# Patient Record
Sex: Female | Born: 1945 | Race: White | Hispanic: No | Marital: Married | Smoking: Never smoker
Health system: Southern US, Community
[De-identification: ages and names within clinical notes are randomized; demographics above are authoritative.]

## PROBLEM LIST (undated history)

## (undated) DIAGNOSIS — E785 Hyperlipidemia, unspecified: Secondary | ICD-10-CM

## (undated) DIAGNOSIS — E119 Type 2 diabetes mellitus without complications: Secondary | ICD-10-CM

## (undated) DIAGNOSIS — K7460 Unspecified cirrhosis of liver: Secondary | ICD-10-CM

## (undated) DIAGNOSIS — E039 Hypothyroidism, unspecified: Secondary | ICD-10-CM

## (undated) HISTORY — DX: Type 2 diabetes mellitus without complications: E11.9

## (undated) HISTORY — PX: ABDOMINAL HYSTERECTOMY: SHX81

## (undated) HISTORY — DX: Hyperlipidemia, unspecified: E78.5

## (undated) HISTORY — PX: TONSILLECTOMY: SUR1361

## (undated) HISTORY — DX: Hypothyroidism, unspecified: E03.9

## (undated) HISTORY — PX: BUNIONECTOMY: SHX129

## (undated) HISTORY — PX: APPENDECTOMY: SHX54

## (undated) HISTORY — DX: Unspecified cirrhosis of liver: K74.60

---

## 2000-10-19 ENCOUNTER — Other Ambulatory Visit: Admission: RE | Admit: 2000-10-19 | Discharge: 2000-10-19 | Payer: Self-pay | Admitting: General Surgery

## 2001-04-19 ENCOUNTER — Ambulatory Visit (HOSPITAL_COMMUNITY): Admission: RE | Admit: 2001-04-19 | Discharge: 2001-04-19 | Payer: Self-pay | Admitting: *Deleted

## 2001-04-19 ENCOUNTER — Encounter: Payer: Self-pay | Admitting: *Deleted

## 2001-05-03 ENCOUNTER — Ambulatory Visit (HOSPITAL_COMMUNITY): Admission: RE | Admit: 2001-05-03 | Discharge: 2001-05-03 | Payer: Self-pay | Admitting: Cardiology

## 2006-11-13 ENCOUNTER — Ambulatory Visit (HOSPITAL_COMMUNITY): Admission: RE | Admit: 2006-11-13 | Discharge: 2006-11-13 | Payer: Self-pay | Admitting: Family Medicine

## 2006-12-19 ENCOUNTER — Emergency Department (HOSPITAL_COMMUNITY): Admission: EM | Admit: 2006-12-19 | Discharge: 2006-12-20 | Payer: Self-pay | Admitting: Emergency Medicine

## 2007-04-13 ENCOUNTER — Ambulatory Visit (HOSPITAL_COMMUNITY): Admission: RE | Admit: 2007-04-13 | Discharge: 2007-04-13 | Payer: Self-pay | Admitting: Family Medicine

## 2010-06-11 ENCOUNTER — Ambulatory Visit (HOSPITAL_COMMUNITY)
Admission: RE | Admit: 2010-06-11 | Discharge: 2010-06-11 | Payer: Self-pay | Source: Home / Self Care | Admitting: Family Medicine

## 2011-04-24 LAB — CBC
MCHC: 34.6
RBC: 4.11
RDW: 13.1

## 2011-04-24 LAB — URINALYSIS, ROUTINE W REFLEX MICROSCOPIC
Glucose, UA: NEGATIVE
Ketones, ur: NEGATIVE
Urobilinogen, UA: 0.2

## 2011-04-24 LAB — URINE CULTURE: Colony Count: 30000

## 2011-04-24 LAB — BASIC METABOLIC PANEL
CO2: 25
Calcium: 9.3
Creatinine, Ser: 1.07
GFR calc Af Amer: >60
GFR calc non Af Amer: 52 — ABNORMAL LOW
Glucose, Bld: 154 — ABNORMAL HIGH
Sodium: 137

## 2011-04-24 LAB — DIFFERENTIAL
Basophils Absolute: 0.1
Basophils Relative: 1
Lymphocytes Relative: 21
Neutro Abs: 7.9 — ABNORMAL HIGH
Neutrophils Relative %: 66

## 2011-04-24 LAB — URINE MICROSCOPIC-ADD ON

## 2011-05-26 ENCOUNTER — Other Ambulatory Visit (HOSPITAL_COMMUNITY): Payer: Self-pay | Admitting: Internal Medicine

## 2011-05-26 DIAGNOSIS — Z139 Encounter for screening, unspecified: Secondary | ICD-10-CM

## 2011-05-28 ENCOUNTER — Other Ambulatory Visit (HOSPITAL_COMMUNITY): Payer: Self-pay

## 2011-06-03 ENCOUNTER — Other Ambulatory Visit (HOSPITAL_COMMUNITY): Payer: Self-pay

## 2012-02-11 ENCOUNTER — Encounter (INDEPENDENT_AMBULATORY_CARE_PROVIDER_SITE_OTHER): Payer: Self-pay | Admitting: *Deleted

## 2012-06-16 ENCOUNTER — Other Ambulatory Visit (HOSPITAL_COMMUNITY): Payer: Self-pay | Admitting: Internal Medicine

## 2012-06-16 DIAGNOSIS — Z01419 Encounter for gynecological examination (general) (routine) without abnormal findings: Secondary | ICD-10-CM

## 2012-06-17 ENCOUNTER — Other Ambulatory Visit (HOSPITAL_COMMUNITY): Payer: Self-pay | Admitting: Family Medicine

## 2012-06-17 DIAGNOSIS — Z139 Encounter for screening, unspecified: Secondary | ICD-10-CM

## 2012-06-21 ENCOUNTER — Other Ambulatory Visit (HOSPITAL_COMMUNITY): Payer: Self-pay

## 2012-06-23 ENCOUNTER — Ambulatory Visit (HOSPITAL_COMMUNITY)
Admission: RE | Admit: 2012-06-23 | Discharge: 2012-06-23 | Disposition: A | Discharge status: Home / Self Care | Payer: Medicare Other | Source: Ambulatory Visit | Attending: Internal Medicine | Admitting: Internal Medicine

## 2012-06-23 DIAGNOSIS — M899 Disorder of bone, unspecified: Secondary | ICD-10-CM | POA: Insufficient documentation

## 2012-06-23 DIAGNOSIS — Z01419 Encounter for gynecological examination (general) (routine) without abnormal findings: Secondary | ICD-10-CM

## 2012-06-23 DIAGNOSIS — M949 Disorder of cartilage, unspecified: Secondary | ICD-10-CM | POA: Insufficient documentation

## 2012-06-24 ENCOUNTER — Encounter (INDEPENDENT_AMBULATORY_CARE_PROVIDER_SITE_OTHER): Payer: Self-pay | Admitting: *Deleted

## 2012-06-24 ENCOUNTER — Ambulatory Visit (HOSPITAL_COMMUNITY): Payer: Self-pay

## 2012-07-01 ENCOUNTER — Ambulatory Visit (HOSPITAL_COMMUNITY): Payer: Self-pay

## 2012-07-06 ENCOUNTER — Ambulatory Visit (HOSPITAL_COMMUNITY)
Admission: RE | Admit: 2012-07-06 | Discharge: 2012-07-06 | Disposition: A | Discharge status: Home / Self Care | Payer: Medicare Other | Source: Ambulatory Visit | Attending: Family Medicine | Admitting: Family Medicine

## 2012-07-06 DIAGNOSIS — Z139 Encounter for screening, unspecified: Secondary | ICD-10-CM

## 2012-07-06 DIAGNOSIS — Z1231 Encounter for screening mammogram for malignant neoplasm of breast: Secondary | ICD-10-CM | POA: Insufficient documentation

## 2013-06-14 ENCOUNTER — Encounter (INDEPENDENT_AMBULATORY_CARE_PROVIDER_SITE_OTHER): Payer: Self-pay | Admitting: *Deleted

## 2013-07-05 ENCOUNTER — Telehealth: Payer: Self-pay

## 2013-07-05 NOTE — Telephone Encounter (Signed)
Pt was referred by Jean Rosenthal, [facility] for screening colonoscopy. I called and spoke to female who said he will give her a message to call.

## 2013-08-06 IMAGING — MG MM DIGITAL SCREENING BILAT
4 series · 4 of 4 positions shown · non-contrast
Comparison: Previous exams.

CLINICAL DATA: Screening.

DIGITAL BILATERAL SCREENING MAMMOGRAM WITH CAD

[L CC]
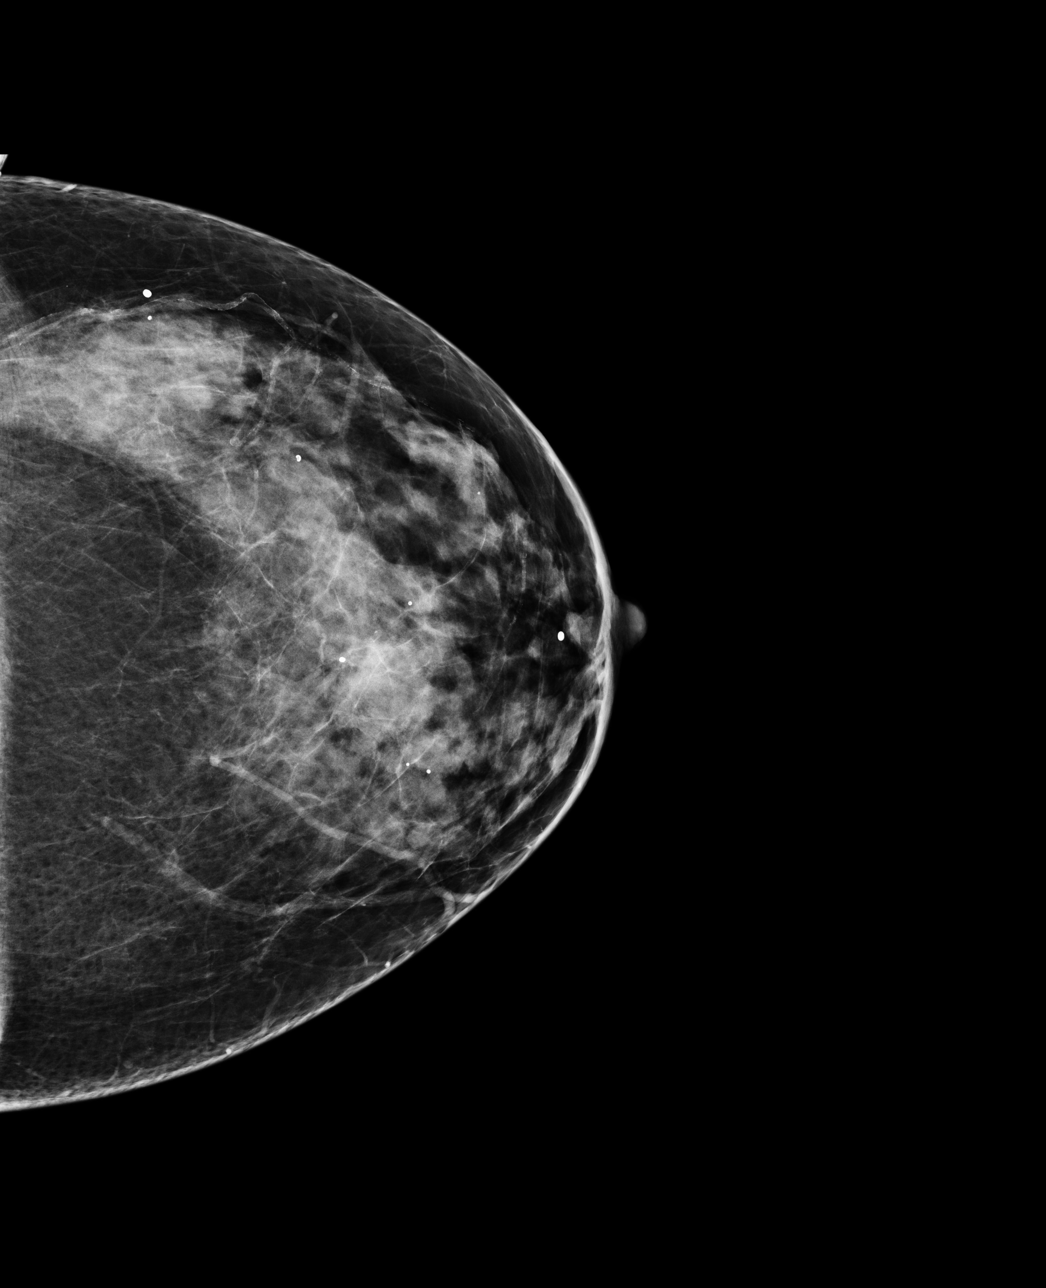

[L MLO]
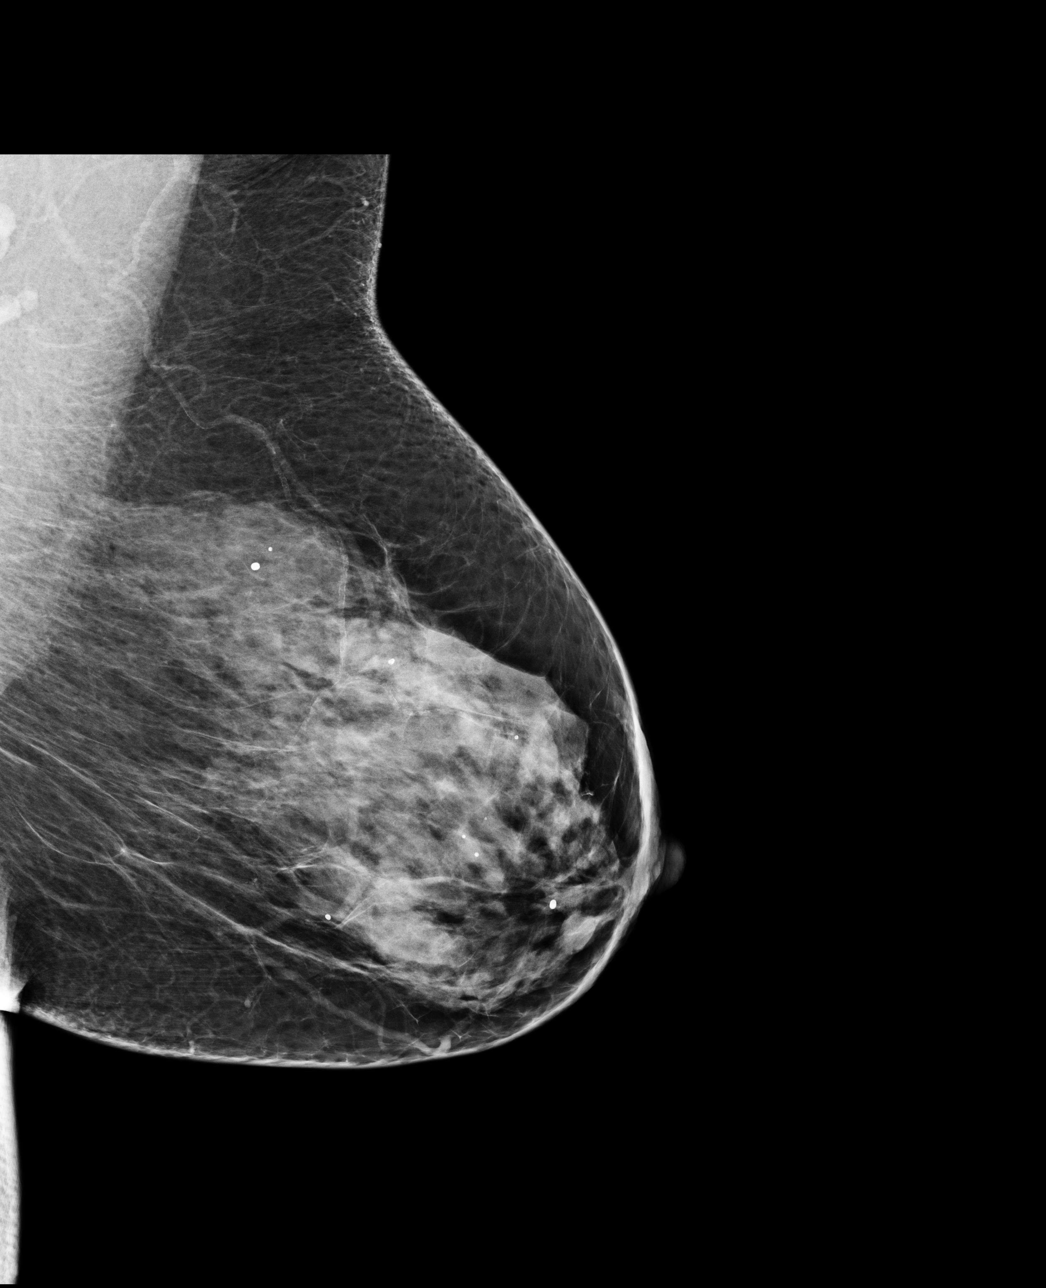

[R CC]
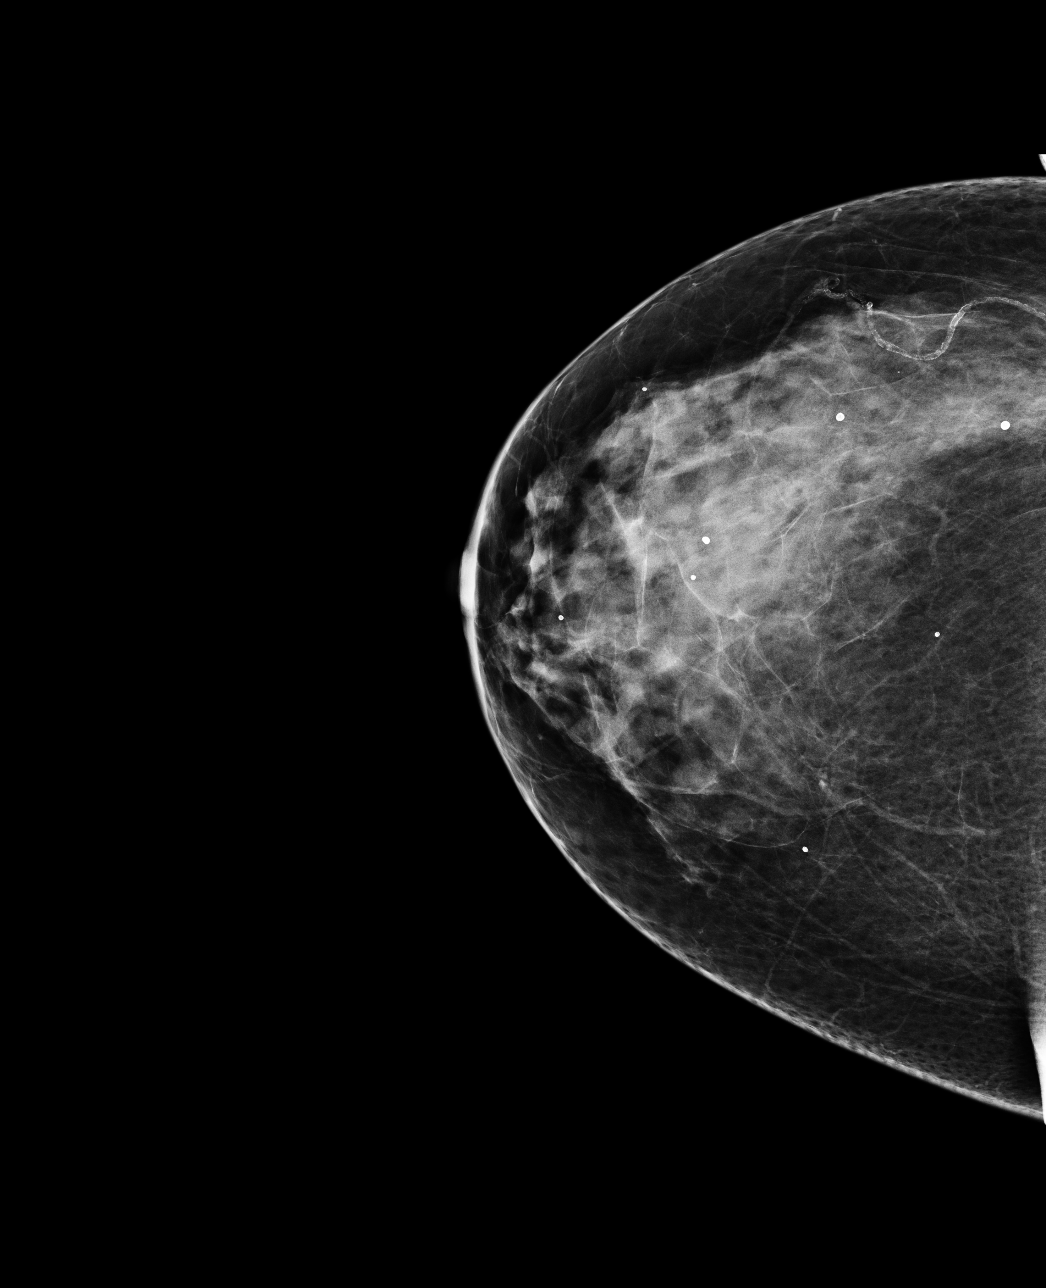

[R MLO]
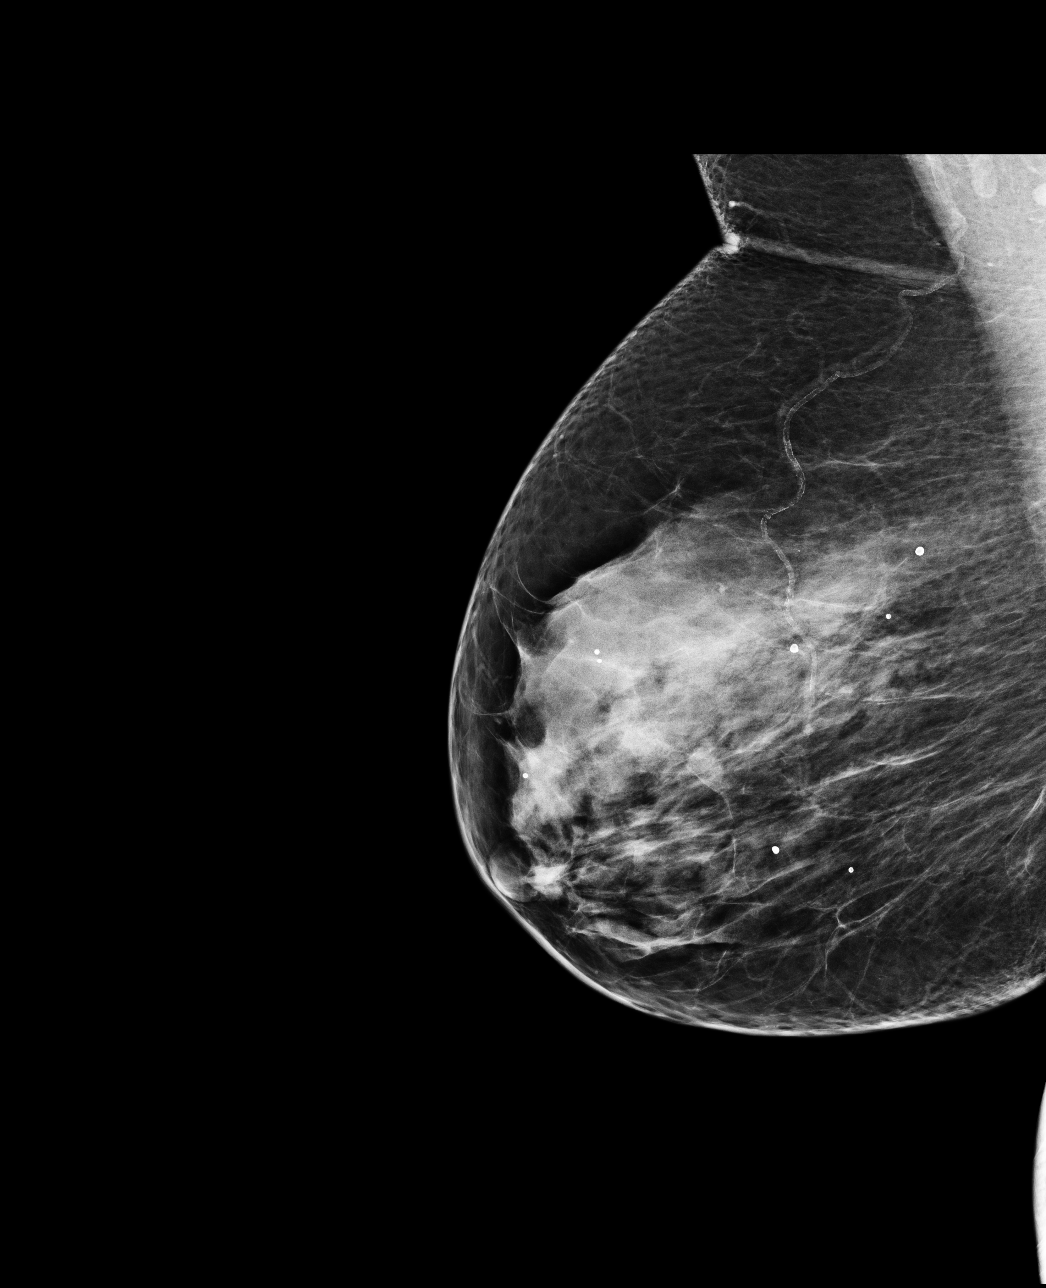

[4 of 4 positions shown; findings below may reference images not displayed]

FINDINGS: ACR Breast Density Category 3: The breast tissue is heterogeneously
dense.

No suspicious masses, architectural distortion, or calcifications
are present.

Images were processed with CAD.
IMPRESSION: No mammographic evidence of malignancy.

A result letter of this screening mammogram will be mailed directly
to the patient.

RECOMMENDATION:
Screening mammogram in one year. (Code:S5-2-VZT)

BI-RADS CATEGORY 1:  Negative.

## 2013-08-25 NOTE — Telephone Encounter (Signed)
Letter to PCP

## 2015-03-09 ENCOUNTER — Other Ambulatory Visit (HOSPITAL_COMMUNITY): Payer: Self-pay | Admitting: Physician Assistant

## 2015-03-13 ENCOUNTER — Other Ambulatory Visit (HOSPITAL_COMMUNITY): Payer: Self-pay | Admitting: Physician Assistant

## 2015-03-13 DIAGNOSIS — M8580 Other specified disorders of bone density and structure, unspecified site: Secondary | ICD-10-CM

## 2015-03-13 DIAGNOSIS — Z780 Asymptomatic menopausal state: Secondary | ICD-10-CM

## 2015-03-13 DIAGNOSIS — Z1231 Encounter for screening mammogram for malignant neoplasm of breast: Secondary | ICD-10-CM

## 2015-03-16 ENCOUNTER — Ambulatory Visit (HOSPITAL_COMMUNITY): Payer: Self-pay

## 2015-03-16 ENCOUNTER — Other Ambulatory Visit (HOSPITAL_COMMUNITY): Payer: Self-pay

## 2015-03-22 ENCOUNTER — Ambulatory Visit (HOSPITAL_COMMUNITY)
Admission: RE | Admit: 2015-03-22 | Discharge: 2015-03-22 | Disposition: A | Discharge status: Home / Self Care | Payer: Medicare Other | Source: Ambulatory Visit | Attending: Physician Assistant | Admitting: Physician Assistant

## 2015-03-22 DIAGNOSIS — Z1231 Encounter for screening mammogram for malignant neoplasm of breast: Secondary | ICD-10-CM | POA: Insufficient documentation

## 2015-03-22 DIAGNOSIS — Z780 Asymptomatic menopausal state: Secondary | ICD-10-CM | POA: Insufficient documentation

## 2015-03-22 DIAGNOSIS — M8580 Other specified disorders of bone density and structure, unspecified site: Secondary | ICD-10-CM | POA: Insufficient documentation

## 2015-04-05 ENCOUNTER — Encounter (INDEPENDENT_AMBULATORY_CARE_PROVIDER_SITE_OTHER): Payer: Self-pay | Admitting: *Deleted

## 2015-09-04 DIAGNOSIS — Z6829 Body mass index (BMI) 29.0-29.9, adult: Secondary | ICD-10-CM | POA: Diagnosis not present

## 2015-09-04 DIAGNOSIS — Z0000 Encounter for general adult medical examination without abnormal findings: Secondary | ICD-10-CM | POA: Diagnosis not present

## 2015-09-04 DIAGNOSIS — Z1389 Encounter for screening for other disorder: Secondary | ICD-10-CM | POA: Diagnosis not present

## 2015-09-04 DIAGNOSIS — E1165 Type 2 diabetes mellitus with hyperglycemia: Secondary | ICD-10-CM | POA: Diagnosis not present

## 2015-09-04 DIAGNOSIS — E663 Overweight: Secondary | ICD-10-CM | POA: Diagnosis not present

## 2015-09-04 DIAGNOSIS — E119 Type 2 diabetes mellitus without complications: Secondary | ICD-10-CM | POA: Diagnosis not present

## 2015-09-04 DIAGNOSIS — Z0001 Encounter for general adult medical examination with abnormal findings: Secondary | ICD-10-CM | POA: Diagnosis not present

## 2015-09-19 ENCOUNTER — Encounter (INDEPENDENT_AMBULATORY_CARE_PROVIDER_SITE_OTHER): Payer: Self-pay | Admitting: *Deleted

## 2016-03-06 DIAGNOSIS — Z1389 Encounter for screening for other disorder: Secondary | ICD-10-CM | POA: Diagnosis not present

## 2016-03-06 DIAGNOSIS — E6609 Other obesity due to excess calories: Secondary | ICD-10-CM | POA: Diagnosis not present

## 2016-03-06 DIAGNOSIS — E782 Mixed hyperlipidemia: Secondary | ICD-10-CM | POA: Diagnosis not present

## 2016-03-06 DIAGNOSIS — E1165 Type 2 diabetes mellitus with hyperglycemia: Secondary | ICD-10-CM | POA: Diagnosis not present

## 2016-03-06 DIAGNOSIS — Z6830 Body mass index (BMI) 30.0-30.9, adult: Secondary | ICD-10-CM | POA: Diagnosis not present

## 2016-03-06 DIAGNOSIS — Z1211 Encounter for screening for malignant neoplasm of colon: Secondary | ICD-10-CM | POA: Diagnosis not present

## 2016-07-18 DIAGNOSIS — E782 Mixed hyperlipidemia: Secondary | ICD-10-CM | POA: Diagnosis not present

## 2016-07-18 DIAGNOSIS — Z6831 Body mass index (BMI) 31.0-31.9, adult: Secondary | ICD-10-CM | POA: Diagnosis not present

## 2016-07-18 DIAGNOSIS — I10 Essential (primary) hypertension: Secondary | ICD-10-CM | POA: Diagnosis not present

## 2016-07-18 DIAGNOSIS — E1165 Type 2 diabetes mellitus with hyperglycemia: Secondary | ICD-10-CM | POA: Diagnosis not present

## 2016-07-18 DIAGNOSIS — E063 Autoimmune thyroiditis: Secondary | ICD-10-CM | POA: Diagnosis not present

## 2016-07-18 DIAGNOSIS — Z1389 Encounter for screening for other disorder: Secondary | ICD-10-CM | POA: Diagnosis not present

## 2016-07-18 DIAGNOSIS — E6609 Other obesity due to excess calories: Secondary | ICD-10-CM | POA: Diagnosis not present

## 2016-10-23 ENCOUNTER — Other Ambulatory Visit (HOSPITAL_COMMUNITY): Payer: Self-pay | Admitting: Family Medicine

## 2016-10-23 DIAGNOSIS — Z1231 Encounter for screening mammogram for malignant neoplasm of breast: Secondary | ICD-10-CM

## 2016-10-30 ENCOUNTER — Ambulatory Visit (HOSPITAL_COMMUNITY)
Admission: RE | Admit: 2016-10-30 | Discharge: 2016-10-30 | Disposition: A | Discharge status: Home / Self Care | Payer: PPO | Source: Ambulatory Visit | Attending: Family Medicine | Admitting: Family Medicine

## 2016-10-30 DIAGNOSIS — Z1231 Encounter for screening mammogram for malignant neoplasm of breast: Secondary | ICD-10-CM | POA: Diagnosis not present

## 2016-11-27 DIAGNOSIS — Z1389 Encounter for screening for other disorder: Secondary | ICD-10-CM | POA: Diagnosis not present

## 2016-11-27 DIAGNOSIS — E782 Mixed hyperlipidemia: Secondary | ICD-10-CM | POA: Diagnosis not present

## 2016-11-27 DIAGNOSIS — Z6830 Body mass index (BMI) 30.0-30.9, adult: Secondary | ICD-10-CM | POA: Diagnosis not present

## 2016-11-27 DIAGNOSIS — I10 Essential (primary) hypertension: Secondary | ICD-10-CM | POA: Diagnosis not present

## 2016-11-27 DIAGNOSIS — E1129 Type 2 diabetes mellitus with other diabetic kidney complication: Secondary | ICD-10-CM | POA: Diagnosis not present

## 2016-11-27 DIAGNOSIS — E063 Autoimmune thyroiditis: Secondary | ICD-10-CM | POA: Diagnosis not present

## 2016-12-17 DIAGNOSIS — E1129 Type 2 diabetes mellitus with other diabetic kidney complication: Secondary | ICD-10-CM | POA: Diagnosis not present

## 2016-12-18 DIAGNOSIS — E1129 Type 2 diabetes mellitus with other diabetic kidney complication: Secondary | ICD-10-CM | POA: Diagnosis not present

## 2017-04-16 DIAGNOSIS — E1165 Type 2 diabetes mellitus with hyperglycemia: Secondary | ICD-10-CM | POA: Diagnosis not present

## 2017-04-16 DIAGNOSIS — E6609 Other obesity due to excess calories: Secondary | ICD-10-CM | POA: Diagnosis not present

## 2017-04-16 DIAGNOSIS — E782 Mixed hyperlipidemia: Secondary | ICD-10-CM | POA: Diagnosis not present

## 2017-04-16 DIAGNOSIS — Z6830 Body mass index (BMI) 30.0-30.9, adult: Secondary | ICD-10-CM | POA: Diagnosis not present

## 2017-07-30 DIAGNOSIS — E782 Mixed hyperlipidemia: Secondary | ICD-10-CM | POA: Diagnosis not present

## 2017-07-30 DIAGNOSIS — E119 Type 2 diabetes mellitus without complications: Secondary | ICD-10-CM | POA: Diagnosis not present

## 2017-07-30 DIAGNOSIS — E063 Autoimmune thyroiditis: Secondary | ICD-10-CM | POA: Diagnosis not present

## 2017-07-30 DIAGNOSIS — Z6829 Body mass index (BMI) 29.0-29.9, adult: Secondary | ICD-10-CM | POA: Diagnosis not present

## 2017-07-30 DIAGNOSIS — E1165 Type 2 diabetes mellitus with hyperglycemia: Secondary | ICD-10-CM | POA: Diagnosis not present

## 2017-07-30 DIAGNOSIS — Z1389 Encounter for screening for other disorder: Secondary | ICD-10-CM | POA: Diagnosis not present

## 2018-05-20 DIAGNOSIS — E1165 Type 2 diabetes mellitus with hyperglycemia: Secondary | ICD-10-CM | POA: Diagnosis not present

## 2018-05-20 DIAGNOSIS — Z6829 Body mass index (BMI) 29.0-29.9, adult: Secondary | ICD-10-CM | POA: Diagnosis not present

## 2018-05-20 DIAGNOSIS — E063 Autoimmune thyroiditis: Secondary | ICD-10-CM | POA: Diagnosis not present

## 2018-05-20 DIAGNOSIS — E7849 Other hyperlipidemia: Secondary | ICD-10-CM | POA: Diagnosis not present

## 2018-05-20 DIAGNOSIS — Z1389 Encounter for screening for other disorder: Secondary | ICD-10-CM | POA: Diagnosis not present

## 2018-05-20 DIAGNOSIS — E663 Overweight: Secondary | ICD-10-CM | POA: Diagnosis not present

## 2018-08-24 ENCOUNTER — Other Ambulatory Visit (HOSPITAL_COMMUNITY): Payer: Self-pay | Admitting: Family Medicine

## 2018-08-24 DIAGNOSIS — Z1231 Encounter for screening mammogram for malignant neoplasm of breast: Secondary | ICD-10-CM

## 2018-09-08 ENCOUNTER — Ambulatory Visit (HOSPITAL_COMMUNITY)
Admission: RE | Admit: 2018-09-08 | Discharge: 2018-09-08 | Disposition: A | Discharge status: Home / Self Care | Payer: PPO | Source: Ambulatory Visit | Attending: Family Medicine | Admitting: Family Medicine

## 2018-09-08 DIAGNOSIS — Z1231 Encounter for screening mammogram for malignant neoplasm of breast: Secondary | ICD-10-CM | POA: Diagnosis not present

## 2018-09-15 DIAGNOSIS — E1129 Type 2 diabetes mellitus with other diabetic kidney complication: Secondary | ICD-10-CM | POA: Diagnosis not present

## 2018-09-24 DIAGNOSIS — E1129 Type 2 diabetes mellitus with other diabetic kidney complication: Secondary | ICD-10-CM | POA: Diagnosis not present

## 2018-09-24 DIAGNOSIS — Z1389 Encounter for screening for other disorder: Secondary | ICD-10-CM | POA: Diagnosis not present

## 2018-09-24 DIAGNOSIS — Z0001 Encounter for general adult medical examination with abnormal findings: Secondary | ICD-10-CM | POA: Diagnosis not present

## 2018-09-24 DIAGNOSIS — N182 Chronic kidney disease, stage 2 (mild): Secondary | ICD-10-CM | POA: Diagnosis not present

## 2018-09-24 DIAGNOSIS — E663 Overweight: Secondary | ICD-10-CM | POA: Diagnosis not present

## 2018-09-24 DIAGNOSIS — Z6829 Body mass index (BMI) 29.0-29.9, adult: Secondary | ICD-10-CM | POA: Diagnosis not present

## 2018-09-24 DIAGNOSIS — I10 Essential (primary) hypertension: Secondary | ICD-10-CM | POA: Diagnosis not present

## 2018-09-24 DIAGNOSIS — E063 Autoimmune thyroiditis: Secondary | ICD-10-CM | POA: Diagnosis not present

## 2018-10-07 DIAGNOSIS — G8929 Other chronic pain: Secondary | ICD-10-CM | POA: Diagnosis not present

## 2018-10-07 DIAGNOSIS — E1165 Type 2 diabetes mellitus with hyperglycemia: Secondary | ICD-10-CM | POA: Diagnosis not present

## 2018-10-07 DIAGNOSIS — M542 Cervicalgia: Secondary | ICD-10-CM | POA: Diagnosis not present

## 2019-01-14 DIAGNOSIS — I10 Essential (primary) hypertension: Secondary | ICD-10-CM | POA: Diagnosis not present

## 2019-01-14 DIAGNOSIS — E7849 Other hyperlipidemia: Secondary | ICD-10-CM | POA: Diagnosis not present

## 2019-01-14 DIAGNOSIS — Z6829 Body mass index (BMI) 29.0-29.9, adult: Secondary | ICD-10-CM | POA: Diagnosis not present

## 2019-01-14 DIAGNOSIS — E663 Overweight: Secondary | ICD-10-CM | POA: Diagnosis not present

## 2019-01-14 DIAGNOSIS — Z1389 Encounter for screening for other disorder: Secondary | ICD-10-CM | POA: Diagnosis not present

## 2019-01-14 DIAGNOSIS — E118 Type 2 diabetes mellitus with unspecified complications: Secondary | ICD-10-CM | POA: Diagnosis not present

## 2019-01-21 DIAGNOSIS — Z6829 Body mass index (BMI) 29.0-29.9, adult: Secondary | ICD-10-CM | POA: Diagnosis not present

## 2019-01-21 DIAGNOSIS — E7849 Other hyperlipidemia: Secondary | ICD-10-CM | POA: Diagnosis not present

## 2019-01-21 DIAGNOSIS — Z1389 Encounter for screening for other disorder: Secondary | ICD-10-CM | POA: Diagnosis not present

## 2019-01-21 DIAGNOSIS — E663 Overweight: Secondary | ICD-10-CM | POA: Diagnosis not present

## 2019-02-07 DIAGNOSIS — E119 Type 2 diabetes mellitus without complications: Secondary | ICD-10-CM | POA: Diagnosis not present

## 2019-04-05 DIAGNOSIS — N182 Chronic kidney disease, stage 2 (mild): Secondary | ICD-10-CM | POA: Diagnosis not present

## 2019-04-05 DIAGNOSIS — I10 Essential (primary) hypertension: Secondary | ICD-10-CM | POA: Diagnosis not present

## 2019-11-03 DIAGNOSIS — E119 Type 2 diabetes mellitus without complications: Secondary | ICD-10-CM | POA: Diagnosis not present

## 2019-11-03 DIAGNOSIS — I10 Essential (primary) hypertension: Secondary | ICD-10-CM | POA: Diagnosis not present

## 2019-11-03 DIAGNOSIS — N182 Chronic kidney disease, stage 2 (mild): Secondary | ICD-10-CM | POA: Diagnosis not present

## 2019-11-03 DIAGNOSIS — E7849 Other hyperlipidemia: Secondary | ICD-10-CM | POA: Diagnosis not present

## 2019-11-03 DIAGNOSIS — E1165 Type 2 diabetes mellitus with hyperglycemia: Secondary | ICD-10-CM | POA: Diagnosis not present

## 2019-11-03 DIAGNOSIS — Z1389 Encounter for screening for other disorder: Secondary | ICD-10-CM | POA: Diagnosis not present

## 2019-11-03 DIAGNOSIS — Z0001 Encounter for general adult medical examination with abnormal findings: Secondary | ICD-10-CM | POA: Diagnosis not present

## 2019-11-10 ENCOUNTER — Other Ambulatory Visit: Payer: Self-pay | Admitting: Family Medicine

## 2019-11-10 ENCOUNTER — Other Ambulatory Visit (HOSPITAL_COMMUNITY): Payer: Self-pay | Admitting: Family Medicine

## 2019-11-10 DIAGNOSIS — R7401 Elevation of levels of liver transaminase levels: Secondary | ICD-10-CM

## 2019-11-16 ENCOUNTER — Ambulatory Visit (HOSPITAL_COMMUNITY)
Admission: RE | Admit: 2019-11-16 | Discharge: 2019-11-16 | Disposition: A | Discharge status: Home / Self Care | Payer: Medicare Other | Source: Ambulatory Visit | Attending: Family Medicine | Admitting: Family Medicine

## 2019-11-16 ENCOUNTER — Other Ambulatory Visit: Payer: Self-pay

## 2019-11-16 DIAGNOSIS — R748 Abnormal levels of other serum enzymes: Secondary | ICD-10-CM | POA: Diagnosis not present

## 2019-11-16 DIAGNOSIS — R7401 Elevation of levels of liver transaminase levels: Secondary | ICD-10-CM | POA: Insufficient documentation

## 2019-11-16 DIAGNOSIS — N281 Cyst of kidney, acquired: Secondary | ICD-10-CM | POA: Diagnosis not present

## 2019-11-21 DIAGNOSIS — R7401 Elevation of levels of liver transaminase levels: Secondary | ICD-10-CM | POA: Diagnosis not present

## 2019-11-22 ENCOUNTER — Encounter: Payer: Self-pay | Admitting: Internal Medicine

## 2019-11-23 ENCOUNTER — Encounter: Payer: Self-pay | Admitting: Internal Medicine

## 2019-12-19 ENCOUNTER — Other Ambulatory Visit: Payer: Self-pay

## 2019-12-19 ENCOUNTER — Ambulatory Visit: Payer: Medicare Other | Admitting: Gastroenterology

## 2019-12-19 ENCOUNTER — Encounter: Payer: Self-pay | Admitting: Gastroenterology

## 2019-12-19 DIAGNOSIS — R932 Abnormal findings on diagnostic imaging of liver and biliary tract: Secondary | ICD-10-CM | POA: Insufficient documentation

## 2019-12-19 DIAGNOSIS — R945 Abnormal results of liver function studies: Secondary | ICD-10-CM

## 2019-12-19 DIAGNOSIS — R7989 Other specified abnormal findings of blood chemistry: Secondary | ICD-10-CM

## 2019-12-19 DIAGNOSIS — K862 Cyst of pancreas: Secondary | ICD-10-CM

## 2019-12-19 NOTE — Progress Notes (Signed)
Primary Care Physician:  Ginger Organ  Primary Gastroenterologist:  Garfield Cornea, MD   Chief Complaint  Patient presents with  . Cirrhosis    had elevated lft's and [place]    HPI:  Jaime Matthews is a 74 y.o. female here at the request of Delman Cheadle, PA-C with [facility], for further evaluation of cirrhosis.   Patient states she had recent blood work that showed elevated LFTs.  For this reason Delman Cheadle, PA-C ordered abdominal ultrasound.  Ultrasound showed somewhat nodular and coarse liver concerning for cirrhosis, normal spleen, no gallstones, CBD 4 mm, cyst in the body of the pancreas measuring 7 x 4 mm, small cyst in the left kidney.  Patient states she has never been told anything was wrong with her liver.  She has had diabetes for greater than 10 years.  She reports that her A1c typically has been in the 5-7 range but I did go over 9 in the last 6 months due to dietary indiscretions.  She is now back on track with her diet.  7 weeks ago she was switched to Trulicity, had been on insulin most recently.   Overall she feels good.  Her appetite is good.  No nausea or vomiting.  No abdominal pain.  Bowel movements are regular.  No blood in the stool or melena.  At her heaviest years ago she weighed 214 pounds.  She is 177 pounds today.  She reports several colonoscopies at [facility], never had any polyps.  States she "aged out".  No prior EGD.  No family history of colon cancer, liver disease.    Current Outpatient Medications  Medication Sig Dispense Refill  . aspirin EC 81 MG tablet Take 81 mg by mouth daily. Swallow whole.    Marland Kitchen atorvastatin (LIPITOR) 10 MG tablet Take 1 tablet by mouth daily.    . Dulaglutide (TRULICITY) 1.5 CH/8.8FO SOPN Inject into the skin every 7 (seven) days.    Marland Kitchen levothyroxine (SYNTHROID) 50 MCG tablet Take 1 tablet by mouth daily.    Marland Kitchen lisinopril (ZESTRIL) 2.5 MG tablet Take 1 tablet by mouth daily.    .  Multiple Vitamins-Minerals (CENTRUM SILVER 50+WOMEN) TABS Take 1 tablet by mouth daily.    . Omega-3 Fatty Acids (FISH OIL) 1200 MG CAPS Take 2 capsules by mouth daily.     No current facility-administered medications for this visit.    Allergies as of 12/19/2019  . (No Known Allergies)    Past Medical History:  Diagnosis Date  . Diabetes ([place])    diagosed around 2010.   Marland Kitchen Hyperlipidemia   . Hypothyroidism     Past Surgical History:  Procedure Laterality Date  . ABDOMINAL HYSTERECTOMY    . APPENDECTOMY    . BUNIONECTOMY    . TONSILLECTOMY      Family History  Problem Relation Age of Onset  . Thyroid disease Mother   . Prostate cancer Father   . Melanoma Father        died in his 51s  . Diabetes Brother   . Colon cancer Neg Hx   . Liver disease Neg Hx     Social History   Socioeconomic History  . Marital status: Married    Spouse name: Not on file  . Number of children: Not on file  . Years of education: Not on file  . Highest education level: Not on file  Occupational History  . Not on file  Tobacco Use  .  Smoking status: Never Smoker  . Smokeless tobacco: Never Used  Substance and Sexual Activity  . Alcohol use: Never  . Drug use: Never  . Sexual activity: Not on file  Other Topics Concern  . Not on file  Social History Narrative  . Not on file   Social Determinants of Health   Financial Resource Strain:   . Difficulty of Paying Living Expenses:   Food Insecurity:   . Worried About Charity fundraiser in the Last Year:   . Arboriculturist in the Last Year:   Transportation Needs:   . Film/video editor (Medical):   Marland Kitchen Lack of Transportation (Non-Medical):   Physical Activity:   . Days of Exercise per Week:   . Minutes of Exercise per Session:   Stress:   . Feeling of Stress :   Social Connections:   . Frequency of Communication with Friends and Family:   . Frequency of Social Gatherings with Friends and Family:   . Attends Religious  Services:   . Active Member of Clubs or Organizations:   . Attends Archivist Meetings:   Marland Kitchen Marital Status:   Intimate Partner Violence:   . Fear of Current or Ex-Partner:   . Emotionally Abused:   Marland Kitchen Physically Abused:   . Sexually Abused:       ROS:  General: Negative for anorexia, weight loss, fever, chills, fatigue, weakness. Eyes: Negative for vision changes.  ENT: Negative for hoarseness, difficulty swallowing , nasal congestion. CV: Negative for chest pain, angina, palpitations, dyspnea on exertion, peripheral edema.  Respiratory: Negative for dyspnea at rest, dyspnea on exertion, cough, sputum, wheezing.  GI: See history of present illness. GU:  Negative for dysuria, hematuria, urinary incontinence, urinary frequency, nocturnal urination.  MS: Negative for joint pain, low back pain.  Derm: Negative for rash or itching.  Neuro: Negative for weakness, abnormal sensation, seizure, frequent headaches, memory loss, confusion.  Psych: Negative for anxiety, depression, suicidal ideation, hallucinations.  Endo: Negative for unusual weight change.  Heme: Negative for bruising or bleeding. Allergy: Negative for rash or hives.    Physical Examination:  BP 137/82   Pulse 86   Temp (!) 96.8 F (36 C) (Temporal)   Ht '5\' 5"'  (1.651 m)   Wt 177 lb 6.4 oz (80.5 kg)   BMI 29.52 kg/m    General: Well-nourished, well-developed in no acute distress.  Head: Normocephalic, atraumatic.   Eyes: Conjunctiva pink, no icterus. Mouth: masked Neck: Supple without thyromegaly, masses, or lymphadenopathy.  Lungs: Clear to auscultation bilaterally.  Heart: Regular rate and rhythm, no murmurs rubs or gallops.  Abdomen: Bowel sounds are normal, nontender, nondistended, no hepatosplenomegaly or masses, no abdominal bruits or    hernia , no rebound or guarding.   Rectal: Not performed Extremities: No lower extremity edema. No clubbing or deformities.  Neuro: Alert and oriented x 4 ,  grossly normal neurologically.  Skin: Warm and dry, no rash or jaundice.   Psych: Alert and cooperative, normal mood and affect.  Labs: November 03, 2019: White blood cell count 8800, sodium 141, potassium 4.6, platelets 161,000, MCV 94, hemoglobin 13.9, hematocrit 40.7, glucose 256, creatinine 1.05, total bilirubin 0.8, AST 54, ALT 74, alk phos 104, albumin 4.5,  Imaging Studies: No results found.  Impression/plan:  Pleasant 74 year old female with history of diabetes, hyperlipidemia, obesity recently with abnormal LFTs and ultrasound concerning for cirrhosis.  She states she had follow-up labs that showed much improvement in her LFTs.  We have requested labs for further review.  I suspect she has Karlene Lineman with early cirrhosis based on above information provided thus far.  We will ruled out viral hepatitis, hemochromatosis, autoimmune process, as well as check for hepatitis A and B immunity.  We will calculate meld sodium score.  Given current platelets and albumin I do not suspect advanced cirrhosis at this time.  Patient is aware that we will need to follow her 2-3 times per year.  She will require at minimum, labs and ultrasound every 6 months.  I have encouraged her to increase her daily exercise as tolerated.  Control diabetes and cholesterol as tightly as she can.    She also had small pancreatic cyst on ultrasound which will require further evaluation/surveillance.  Further recommendations to follow once all records were needed.

## 2019-12-19 NOTE — Progress Notes (Signed)
CC'ED TO PCP 

## 2019-12-19 NOTE — Patient Instructions (Signed)
1. I will request most recent labs from PCP before ordering additional labs. We will let you know next step once reviewed.  2. Please work hard to control diabetes, cholesterol. Add exercise into your daily regimen, as tolerated. Moderate paced walking 30 minutes 5 days a week if you are able to.  3. We will need to see you at least every six months to check liver function and update your ultrasound.    Cirrhosis  Cirrhosis is long-term (chronic) liver injury. The liver is the body's largest internal organ, and it performs many functions. It converts food into energy, removes toxic material from the blood, makes important proteins, and absorbs necessary vitamins from food. In cirrhosis, healthy liver cells are replaced by scar tissue. This prevents blood from flowing through the liver, making it difficult for the liver to function. Scarring of the liver cannot be reversed, but treatment can prevent it from getting worse. What are the causes? Common causes of this condition are hepatitis C and long-term alcohol abuse. Other causes include:  Nonalcoholic fatty liver disease. This happens when fat is deposited in the liver by causes other than alcohol.  Hepatitis B infection.  Autoimmune hepatitis. In this condition, the body's defense system (immune system) mistakenly attacks the liver cells, causing irritation and swelling (inflammation).  Diseases that cause blockage of ducts inside the liver.  Inherited liver diseases, such as hemochromatosis. This is one of the most common inherited liver diseases. In this disease, deposits of iron collect in the liver and other organs.  Reactions to certain long-term medicines, such as amiodarone, a heart medicine.  Parasitic infections. These include schistosomiasis, which is caused by a flatworm.  Long-term contact to certain toxins. These toxins include certain organic solvents, such as toluene and chloroform. What increases the risk? You are more  likely to develop this condition if:  You have certain types of viral hepatitis.  You abuse alcohol, especially if you are female.  You are overweight.  You share needles.  You have unprotected sex with someone who has viral hepatitis. What are the signs or symptoms? You may not have any signs and symptoms at first. Symptoms may not develop until the damage to your liver starts to get worse. Early symptoms may include:  Weakness and tiredness (fatigue).  Changes in sleep patterns or having trouble sleeping.  Itchiness.  Tenderness in the right-upper part of your abdomen.  Weight loss and muscle loss.  Nausea.  Loss of appetite.  Appearance of tiny blood vessels under the skin. Later symptoms may include:  Fatigue or weakness that is getting worse.  Yellow skin and eyes (jaundice).  Buildup of fluid in the abdomen (ascites). You may notice that your clothes are tight around your waist.  Weight gain.  Swelling of the feet and ankles (edema).  Trouble breathing.  Easy bruising and bleeding.  Vomiting blood.  Black or bloody stool.  Mental confusion. How is this diagnosed? Your health care provider may suspect cirrhosis based on your symptoms and medical history, especially if you have other medical conditions or a history of alcohol abuse. Your health care provider will do a physical exam to feel your liver and to check for signs of cirrhosis. He or she may perform other tests, including:  Blood tests to check: ? For hepatitis B or C. ? Kidney function. ? Liver function.  Imaging tests such as: ? MRI or CT scan to look for changes seen in advanced cirrhosis. ? Ultrasound to see if  normal liver tissue is being replaced by scar tissue.  A procedure in which a long needle is used to take a sample of liver tissue to be checked in a lab (biopsy). Liver biopsy can confirm the diagnosis of cirrhosis. How is this treated? Treatment for this condition depends on  how damaged your liver is and what caused the damage. It may include treating the symptoms of cirrhosis, or treating the underlying causes in order to slow the damage. Treatment may include:  Making lifestyle changes, such as: ? Eating a healthy diet. You may need to work with your health care provider or a diet and nutrition specialist (dietitian) to develop an eating plan. ? Restricting salt intake. ? Maintaining a healthy weight. ? Not abusing drugs or alcohol.  Taking medicines to: ? Treat liver infections or other infections. ? Control itching. ? Reduce fluid buildup. ? Reduce certain blood toxins. ? Reduce risk of bleeding from enlarged blood vessels in the stomach or esophagus (varices).  Liver transplant. In this procedure, a liver from a donor is used to replace your diseased liver. This is done if cirrhosis has caused liver failure. Other treatments and procedures may be done depending on the problems that you get from cirrhosis. Common problems include liver-related kidney failure (hepatorenal syndrome). Follow these instructions at home:   Take medicines only as told by your health care provider. Do not use medicines that are toxic to your liver. Ask your health care provider before taking any new medicines, including over-the-counter medicines.  Rest as needed.  Eat a well-balanced diet. Ask your health care provider or dietitian for more information.  Limit your salt or water intake, if your health care provider asks you to do this.  Do not drink alcohol. This is especially important if you are taking acetaminophen.  Keep all follow-up visits as told by your health care provider. This is important. Contact a health care provider if you:  Have fatigue or weakness that is getting worse.  Develop swelling of the hands, feet, legs, or face.  Have a fever.  Develop loss of appetite.  Have nausea or vomiting.  Develop jaundice.  Develop easy bruising or  bleeding. Get help right away if you:  Vomit bright red blood or a material that looks like coffee grounds.  Have blood in your stools.  Notice that your stools appear black and tarry.  Become confused.  Have chest pain or trouble breathing. Summary  Cirrhosis is chronic liver injury. Liver damage cannot be reversed. Common causes are hepatitis C and long-term alcohol abuse.  Tests used to diagnose cirrhosis include blood tests, imaging tests, and liver biopsy.  Treatment for this condition involves treating the underlying cause. Avoid alcohol, drugs, salt, and medicines that may damage your liver.  Contact your health care provider if you develop ascites, edema, jaundice, fever, nausea or vomiting, easy bruising or bleeding, or worsening fatigue. This information is not intended to replace advice given to you by your health care provider. Make sure you discuss any questions you have with your health care provider. Document Revised: 10/13/2018 Document Reviewed: 05/13/2017 Elsevier Patient Education  [place].

## 2019-12-23 ENCOUNTER — Ambulatory Visit: Payer: Medicare Other | Admitting: Gastroenterology

## 2020-01-04 DIAGNOSIS — E1122 Type 2 diabetes mellitus with diabetic chronic kidney disease: Secondary | ICD-10-CM | POA: Diagnosis not present

## 2020-01-04 DIAGNOSIS — E063 Autoimmune thyroiditis: Secondary | ICD-10-CM | POA: Diagnosis not present

## 2020-01-04 DIAGNOSIS — N182 Chronic kidney disease, stage 2 (mild): Secondary | ICD-10-CM | POA: Diagnosis not present

## 2020-01-04 DIAGNOSIS — I129 Hypertensive chronic kidney disease with stage 1 through stage 4 chronic kidney disease, or unspecified chronic kidney disease: Secondary | ICD-10-CM | POA: Diagnosis not present

## 2020-01-17 ENCOUNTER — Other Ambulatory Visit (HOSPITAL_COMMUNITY): Payer: Self-pay | Admitting: Family Medicine

## 2020-01-17 DIAGNOSIS — Z1231 Encounter for screening mammogram for malignant neoplasm of breast: Secondary | ICD-10-CM

## 2020-01-23 ENCOUNTER — Other Ambulatory Visit: Payer: Self-pay

## 2020-01-23 ENCOUNTER — Ambulatory Visit (HOSPITAL_COMMUNITY)
Admission: RE | Admit: 2020-01-23 | Discharge: 2020-01-23 | Disposition: A | Discharge status: Home / Self Care | Payer: Medicare Other | Source: Ambulatory Visit | Attending: Family Medicine | Admitting: Family Medicine

## 2020-01-23 DIAGNOSIS — Z1231 Encounter for screening mammogram for malignant neoplasm of breast: Secondary | ICD-10-CM

## 2020-02-03 ENCOUNTER — Telehealth: Payer: Self-pay | Admitting: Gastroenterology

## 2020-02-03 DIAGNOSIS — I129 Hypertensive chronic kidney disease with stage 1 through stage 4 chronic kidney disease, or unspecified chronic kidney disease: Secondary | ICD-10-CM | POA: Diagnosis not present

## 2020-02-03 DIAGNOSIS — I10 Essential (primary) hypertension: Secondary | ICD-10-CM | POA: Diagnosis not present

## 2020-02-03 DIAGNOSIS — N182 Chronic kidney disease, stage 2 (mild): Secondary | ICD-10-CM | POA: Diagnosis not present

## 2020-02-03 DIAGNOSIS — E1122 Type 2 diabetes mellitus with diabetic chronic kidney disease: Secondary | ICD-10-CM | POA: Diagnosis not present

## 2020-02-03 DIAGNOSIS — E7849 Other hyperlipidemia: Secondary | ICD-10-CM | POA: Diagnosis not present

## 2020-02-03 DIAGNOSIS — E063 Autoimmune thyroiditis: Secondary | ICD-10-CM | POA: Diagnosis not present

## 2020-02-03 DIAGNOSIS — E119 Type 2 diabetes mellitus without complications: Secondary | ICD-10-CM | POA: Diagnosis not present

## 2020-02-03 NOTE — Telephone Encounter (Signed)
Darl Pikes, have we seen records from PCP yet?  Looking for any labs done in the past six months.

## 2020-02-03 NOTE — Telephone Encounter (Signed)
Requested records from PCP °

## 2020-03-01 ENCOUNTER — Telehealth: Payer: Self-pay | Admitting: Gastroenterology

## 2020-03-01 NOTE — Telephone Encounter (Signed)
Reviewed records from PCP -Labs from July 2021: A1c 7.1 -Labs from May 2021: BUN 15, creatinine 0.85, albumin 4.2, total bilirubin 0.7, alkaline phosphatase 77, AST 33, ALT 36, hepatitis C antibody negative, hepatitis B surface antigen negative, hepatitis B core antibody IgM negative, hepatitis A IgM negative. -Labs from April 2021: White blood cell count 8800, hemoglobin 13.9, platelets 161,000, total bilirubin 0.8, alk phos 104, AST 54, ALT 74, A1c 9.6.   Please let the patient know that I reviewed her previous labs including her A1c from July which was much better.  We still need to have her complete the following labs: Hepatitis B core antibody, total Hepatitis B surface antibody Hepatitis A total antibody Iron/TIBC/ferritin IgG/IgA/IgM ANA Anti-smooth muscle antibody Mitochondrial antibody CMET PT/INR Lipase Diagnosis: Pancreatic lesion, cirrhosis, abnormal LFTs

## 2020-03-02 NOTE — Telephone Encounter (Signed)
Lmom, waiting on a return call.  

## 2020-03-05 ENCOUNTER — Other Ambulatory Visit: Payer: Self-pay

## 2020-03-05 DIAGNOSIS — R7989 Other specified abnormal findings of blood chemistry: Secondary | ICD-10-CM

## 2020-03-05 DIAGNOSIS — K7460 Unspecified cirrhosis of liver: Secondary | ICD-10-CM

## 2020-03-05 DIAGNOSIS — K869 Disease of pancreas, unspecified: Secondary | ICD-10-CM

## 2020-03-05 NOTE — Telephone Encounter (Signed)
Pt left message on machine that she was returning nurse's call.  [phone]

## 2020-03-05 NOTE — Telephone Encounter (Signed)
Pt returned call. Pt is aware that Tana Coast, PA has reviewed her previous labs/A1C and addition lab work is need. Labs have been placed and pt will complete labs at [facility], per pts request.

## 2020-03-06 DIAGNOSIS — R945 Abnormal results of liver function studies: Secondary | ICD-10-CM | POA: Diagnosis not present

## 2020-03-06 DIAGNOSIS — K7460 Unspecified cirrhosis of liver: Secondary | ICD-10-CM | POA: Diagnosis not present

## 2020-03-06 DIAGNOSIS — K869 Disease of pancreas, unspecified: Secondary | ICD-10-CM | POA: Diagnosis not present

## 2020-03-07 LAB — COMPREHENSIVE METABOLIC PANEL
ALT: 59 IU/L — ABNORMAL HIGH (ref 0–32)
AST: 41 IU/L — ABNORMAL HIGH (ref 0–40)
Albumin/Globulin Ratio: 1.4 (ref 1.2–2.2)
Albumin: 4.2 g/dL (ref 3.7–4.7)
Alkaline Phosphatase: 87 IU/L (ref 48–121)
BUN/Creatinine Ratio: 17 (ref 12–28)
BUN: 18 mg/dL (ref 8–27)
Bilirubin Total: 0.7 mg/dL (ref 0.0–1.2)
CO2: 23 mmol/L (ref 20–29)
Calcium: 10 mg/dL (ref 8.7–10.3)
Chloride: 103 mmol/L (ref 96–106)
Creatinine, Ser: 1.03 mg/dL — ABNORMAL HIGH (ref 0.57–1.00)
GFR calc Af Amer: 62 mL/min/{1.73_m2} (ref >59)
GFR calc non Af Amer: 54 mL/min/{1.73_m2} — ABNORMAL LOW (ref >59)
Globulin, Total: 3 g/dL (ref 1.5–4.5)
Glucose: 159 mg/dL — ABNORMAL HIGH (ref 65–99)
Potassium: 4.2 mmol/L (ref 3.5–5.2)
Sodium: 138 mmol/L (ref 134–144)
Total Protein: 7.2 g/dL (ref 6.0–8.5)

## 2020-03-07 LAB — HEPATITIS B SURFACE ANTIBODY,QUALITATIVE: Hep B Surface Ab, Qual: NONREACTIVE

## 2020-03-07 LAB — PROTIME-INR
INR: 1.1 (ref 0.9–1.2)
Prothrombin Time: 11 s (ref 9.1–12.0)

## 2020-03-07 LAB — IRON,TIBC AND FERRITIN PANEL
Ferritin: 431 ng/mL — ABNORMAL HIGH (ref 15–150)
Iron Saturation: 36 % (ref 15–55)
Iron: 110 ug/dL (ref 27–139)
Total Iron Binding Capacity: 306 ug/dL (ref 250–450)
UIBC: 196 ug/dL (ref 118–369)

## 2020-03-07 LAB — ANTI-SMOOTH MUSCLE ANTIBODY, IGG: Smooth Muscle Ab: 11 Units (ref 0–19)

## 2020-03-07 LAB — HEPATITIS B CORE ANTIBODY, TOTAL: Hep B Core Total Ab: NEGATIVE

## 2020-03-07 LAB — MITOCHONDRIAL ANTIBODIES: Mitochondrial Ab: <20 Units (ref 0.0–20.0)

## 2020-03-07 LAB — ANA: Anti Nuclear Antibody (ANA): NEGATIVE

## 2020-03-07 LAB — LIPASE: Lipase: 51 U/L (ref 14–85)

## 2020-03-07 LAB — HEPATITIS A ANTIBODY, TOTAL: hep A Total Ab: POSITIVE — AB

## 2020-03-14 ENCOUNTER — Other Ambulatory Visit: Payer: Self-pay

## 2020-03-14 DIAGNOSIS — R7989 Other specified abnormal findings of blood chemistry: Secondary | ICD-10-CM

## 2020-03-14 DIAGNOSIS — K869 Disease of pancreas, unspecified: Secondary | ICD-10-CM

## 2020-04-04 DIAGNOSIS — H2513 Age-related nuclear cataract, bilateral: Secondary | ICD-10-CM | POA: Diagnosis not present

## 2020-04-04 DIAGNOSIS — E119 Type 2 diabetes mellitus without complications: Secondary | ICD-10-CM | POA: Diagnosis not present

## 2020-04-04 DIAGNOSIS — H524 Presbyopia: Secondary | ICD-10-CM | POA: Diagnosis not present

## 2020-04-04 DIAGNOSIS — Z7984 Long term (current) use of oral hypoglycemic drugs: Secondary | ICD-10-CM | POA: Diagnosis not present

## 2020-04-05 DIAGNOSIS — E1129 Type 2 diabetes mellitus with other diabetic kidney complication: Secondary | ICD-10-CM | POA: Diagnosis not present

## 2020-04-05 DIAGNOSIS — I10 Essential (primary) hypertension: Secondary | ICD-10-CM | POA: Diagnosis not present

## 2020-04-09 ENCOUNTER — Other Ambulatory Visit: Payer: Self-pay | Admitting: *Deleted

## 2020-04-09 ENCOUNTER — Encounter: Payer: Self-pay | Admitting: *Deleted

## 2020-04-09 DIAGNOSIS — R7989 Other specified abnormal findings of blood chemistry: Secondary | ICD-10-CM

## 2020-04-09 DIAGNOSIS — K869 Disease of pancreas, unspecified: Secondary | ICD-10-CM

## 2020-05-03 ENCOUNTER — Other Ambulatory Visit (HOSPITAL_COMMUNITY)
Admission: RE | Admit: 2020-05-03 | Discharge: 2020-05-03 | Disposition: A | Discharge status: Home / Self Care | Payer: Medicare Other | Source: Ambulatory Visit | Attending: Gastroenterology | Admitting: Gastroenterology

## 2020-05-03 DIAGNOSIS — R945 Abnormal results of liver function studies: Secondary | ICD-10-CM | POA: Diagnosis not present

## 2020-05-03 DIAGNOSIS — K7460 Unspecified cirrhosis of liver: Secondary | ICD-10-CM | POA: Insufficient documentation

## 2020-05-03 DIAGNOSIS — K869 Disease of pancreas, unspecified: Secondary | ICD-10-CM | POA: Diagnosis not present

## 2020-05-03 LAB — HEPATIC FUNCTION PANEL
ALT: 51 U/L — ABNORMAL HIGH (ref 0–44)
AST: 35 U/L (ref 15–41)
Albumin: 3.8 g/dL (ref 3.5–5.0)
Alkaline Phosphatase: 61 U/L (ref 38–126)
Bilirubin, Direct: 0.1 mg/dL (ref 0.0–0.2)
Indirect Bilirubin: 0.8 mg/dL (ref 0.3–0.9)
Total Bilirubin: 0.9 mg/dL (ref 0.3–1.2)
Total Protein: 7.1 g/dL (ref 6.5–8.1)

## 2020-05-03 LAB — FERRITIN: Ferritin: 265 ng/mL (ref 11–307)

## 2020-05-05 DIAGNOSIS — I10 Essential (primary) hypertension: Secondary | ICD-10-CM | POA: Diagnosis not present

## 2020-05-05 DIAGNOSIS — E1129 Type 2 diabetes mellitus with other diabetic kidney complication: Secondary | ICD-10-CM | POA: Diagnosis not present

## 2020-05-08 LAB — HEMOCHROMATOSIS DNA-PCR(C282Y,H63D)

## 2020-05-10 DIAGNOSIS — E119 Type 2 diabetes mellitus without complications: Secondary | ICD-10-CM | POA: Diagnosis not present

## 2020-05-10 DIAGNOSIS — E7849 Other hyperlipidemia: Secondary | ICD-10-CM | POA: Diagnosis not present

## 2020-05-14 ENCOUNTER — Other Ambulatory Visit: Payer: Self-pay

## 2020-05-14 ENCOUNTER — Encounter: Payer: Self-pay | Admitting: Gastroenterology

## 2020-05-14 ENCOUNTER — Ambulatory Visit: Payer: Medicare Other | Admitting: Gastroenterology

## 2020-05-14 ENCOUNTER — Encounter: Payer: Self-pay | Admitting: *Deleted

## 2020-05-14 VITALS — BP 132/78 | HR 82 | Temp 98.2°F | Ht 65.0 in | Wt 168.0 lb

## 2020-05-14 DIAGNOSIS — K7460 Unspecified cirrhosis of liver: Secondary | ICD-10-CM | POA: Diagnosis not present

## 2020-05-14 DIAGNOSIS — K862 Cyst of pancreas: Secondary | ICD-10-CM

## 2020-05-14 NOTE — Patient Instructions (Signed)
CT scan of the liver and pancrease as scheduled.   Return to the office in six month for routine follow up of your liver.

## 2020-05-14 NOTE — Progress Notes (Signed)
Primary Care Physician: Samuella Bruin  Primary Gastroenterologist:  Roetta Sessions, MD   Chief Complaint  Patient presents with  . abnormal LFT    HPI: Jaime Matthews is a 74 y.o. female here for follow-up of abnormal LFTs.  Patient seen back in June for the first time for evaluation of cirrhosis.  She had labs that showed elevated LFTs by her PCP.  Ultrasound showed somewhat nodular and coarse liver concerning for cirrhosis, normal spleen, cyst in the body of the pancreas measuring 7 x 4 mm.  Patient has never been told that she had abnormality in the liver.  She has diabetes for greater than 10 years.  A1c typically in the 5-7 range but went over nine in the past 1 year.  Started on Trulicity this year.  Labs back in August showed immunity to hepatitis A.  She is not immune to hepatitis B.  Mildly elevated AST/ALT.  Work-up negative for autoimmune hepatitis/PBC.  MELD Na 7.  Ferritin was elevated at 431 with normal iron saturations of 36%.  Repeat labs October 28 showed normal ferritin of 265, AST normal at 35, ALT improved at 51.  HFE genetic markers negative.  Clinically doing well.  Appetite remains good.  No abdominal pain.  Bowel movements are regular.  No heartburn, melena, rectal bleeding.  Some fatigue.   Current Outpatient Medications  Medication Sig Dispense Refill  . aspirin EC 81 MG tablet Take 81 mg by mouth daily. Swallow whole.    Marland Kitchen atorvastatin (LIPITOR) 10 MG tablet Take 1 tablet by mouth daily.    . Dulaglutide (TRULICITY) 1.5 MG/0.5ML SOPN Inject into the skin every 7 (seven) days.    Marland Kitchen levothyroxine (SYNTHROID) 50 MCG tablet Take 1 tablet by mouth daily.    Marland Kitchen lisinopril (ZESTRIL) 2.5 MG tablet Take 1 tablet by mouth daily.    . Multiple Vitamins-Minerals (CENTRUM SILVER 50+WOMEN) TABS Take 1 tablet by mouth daily.    . Omega-3 Fatty Acids (FISH OIL) 1200 MG CAPS Take 2 capsules by mouth daily.     No current facility-administered medications  for this visit.    Allergies as of 05/14/2020  . (No Known Allergies)    ROS:  General: Negative for anorexia, weight loss, fever, chills, fatigue, weakness. ENT: Negative for hoarseness, difficulty swallowing , nasal congestion. CV: Negative for chest pain, angina, palpitations, dyspnea on exertion, peripheral edema.  Respiratory: Negative for dyspnea at rest, dyspnea on exertion, cough, sputum, wheezing.  GI: See history of present illness. GU:  Negative for dysuria, hematuria, urinary incontinence, urinary frequency, nocturnal urination.  Endo: Negative for unusual weight change.    Physical Examination:   BP 132/78   Pulse 82   Temp 98.2 F (36.8 C)   Ht 5\' 5"  (1.651 m)   Wt 168 lb (76.2 kg)   BMI 27.96 kg/m   General: Well-nourished, well-developed in no acute distress.  Eyes: No icterus. Mouth: masked Lungs: Clear to auscultation bilaterally.  Heart: Regular rate and rhythm, no murmurs rubs or gallops.  Abdomen: Bowel sounds are normal, nontender, nondistended, no hepatosplenomegaly or masses, no abdominal bruits or hernia , no rebound or guarding.   Extremities: No lower extremity edema. No clubbing or deformities. Neuro: Alert and oriented x 4   Skin: Warm and dry, no jaundice.   Psych: Alert and cooperative, normal mood and affect.  Labs:  Lab Results  Component Value Date   CREATININE 1.03 (H) 03/06/2020   BUN  18 03/06/2020   NA 138 03/06/2020   K 4.2 03/06/2020   CL 103 03/06/2020   CO2 23 03/06/2020   Lab Results  Component Value Date   ALT 51 (H) 05/03/2020   AST 35 05/03/2020   ALKPHOS 61 05/03/2020   BILITOT 0.9 05/03/2020      Imaging Studies: No results found.   Impression:  74 year old female with history of diabetes, hyperlipidemia, obesity presenting for follow-up of abnormal LFTs and possible cirrhosis based on ultrasound.  She has had normal platelets.  No evidence of portal hypertension.  She may have early cirrhosis based on  [place].  Recent LFTs with improved AST now normal, ALT down to 51.  Due for hepatoma surveillance.  Small pancreatic cyst noted on ultrasound.  Needs further surveillance.  Plan:  1. CT abdomen pelvis with contrast to evaluate for hepatoma and follow-up on pancreatic cyst. 2. We will continue to follow patient every 6 months, updating labs at that time along with hepatoma screening. 3. She is immune to hepatitis A.  We briefly discussed hepatitis B vaccination today based on lack of immunity.  She will let us know if she decides to pursue.  She can have this done with PCP within the health department. 4. Return to the office in 6 months.

## 2020-06-05 DIAGNOSIS — I10 Essential (primary) hypertension: Secondary | ICD-10-CM | POA: Diagnosis not present

## 2020-06-05 DIAGNOSIS — E1129 Type 2 diabetes mellitus with other diabetic kidney complication: Secondary | ICD-10-CM | POA: Diagnosis not present

## 2020-06-07 ENCOUNTER — Other Ambulatory Visit: Payer: Self-pay

## 2020-06-07 ENCOUNTER — Other Ambulatory Visit: Payer: Self-pay | Admitting: Gastroenterology

## 2020-06-07 ENCOUNTER — Ambulatory Visit (HOSPITAL_COMMUNITY)
Admission: RE | Admit: 2020-06-07 | Discharge: 2020-06-07 | Disposition: A | Discharge status: Home / Self Care | Payer: Medicare Other | Source: Ambulatory Visit | Attending: Gastroenterology | Admitting: Gastroenterology

## 2020-06-07 DIAGNOSIS — K862 Cyst of pancreas: Secondary | ICD-10-CM | POA: Diagnosis not present

## 2020-06-07 DIAGNOSIS — K7460 Unspecified cirrhosis of liver: Secondary | ICD-10-CM

## 2020-06-07 DIAGNOSIS — K5730 Diverticulosis of large intestine without perforation or abscess without bleeding: Secondary | ICD-10-CM | POA: Diagnosis not present

## 2020-06-07 DIAGNOSIS — N200 Calculus of kidney: Secondary | ICD-10-CM | POA: Diagnosis not present

## 2020-06-07 LAB — POCT I-STAT CREATININE: Creatinine, Ser: 0.9 mg/dL (ref 0.44–1.00)

## 2020-06-07 MED ORDER — IOHEXOL 300 MG/ML  SOLN
100.0000 mL | Freq: Once | INTRAMUSCULAR | Status: AC | PRN
  Administered 2020-06-07: 100 mL via INTRAVENOUS

## 2020-06-21 ENCOUNTER — Encounter: Payer: Self-pay | Admitting: Internal Medicine

## 2020-08-04 DIAGNOSIS — E1129 Type 2 diabetes mellitus with other diabetic kidney complication: Secondary | ICD-10-CM | POA: Diagnosis not present

## 2020-08-04 DIAGNOSIS — I10 Essential (primary) hypertension: Secondary | ICD-10-CM | POA: Diagnosis not present

## 2020-11-19 ENCOUNTER — Encounter: Payer: Self-pay | Admitting: Gastroenterology

## 2020-11-19 ENCOUNTER — Encounter: Payer: Self-pay | Admitting: *Deleted

## 2020-11-19 ENCOUNTER — Other Ambulatory Visit: Payer: Self-pay

## 2020-11-19 ENCOUNTER — Ambulatory Visit: Payer: Medicare Other | Admitting: Gastroenterology

## 2020-11-19 VITALS — BP 138/87 | HR 76 | Temp 96.6°F | Ht 65.0 in | Wt 166.4 lb

## 2020-11-19 DIAGNOSIS — K7460 Unspecified cirrhosis of liver: Secondary | ICD-10-CM | POA: Diagnosis not present

## 2020-11-19 DIAGNOSIS — R911 Solitary pulmonary nodule: Secondary | ICD-10-CM | POA: Diagnosis not present

## 2020-11-19 DIAGNOSIS — K862 Cyst of pancreas: Secondary | ICD-10-CM | POA: Diagnosis not present

## 2020-11-19 NOTE — Patient Instructions (Addendum)
1. Please have your labs and ultrasound done.  We will be in touch with you regarding results. 2. You will be due for chest CT to follow-up on lung nodule in the next couple of months.  We will call you when it is time to schedule. 3. We will follow-up on pancreatic lesion in December of this year. 4. Return to the office in 6 months.  Call sooner if needed.

## 2020-11-19 NOTE — Progress Notes (Signed)
Primary Care Physician: Ladon Applebaum  Primary Gastroenterologist:  Roetta Sessions, MD   Chief Complaint  Patient presents with  . Cirrhosis    Doing ok    HPI: Special Jaime Matthews is a 75 y.o. female here for follow-up of abnormal LFTs.  Patient last seen November 2011.  Longstanding history of diabetes, hyperlipidemia, obesity.  Previous ultrasound showed somewhat nodular and coarse liver concerning for cirrhosis, normal spleen, cyst in the body of the pancreas measuring 7 x 4 mm.  Previous work-up showed no evidence of autoimmune hepatitis/PBC.  She is immune to hepatitis A but not to hepatitis B.  Meld sodium of 7 back in August 2021.  Ferritin was elevated at 431 with normal iron saturations.  Repeat ferritin October 2021 down to 265.  HFE genetic markers were negative.  She has CT abdomen pelvis December 2021: Nodular cirrhotic contour of the liver noted.  No focal enhancing lesions to suggest hepatocellular carcinoma.  Had a small right liver lobe lesion too small to characterize, we will continue surveillance with ultrasound in 6 months.  She had a 0.7 x 0.6 x 0.6 cm fluid density lesion in the body of the pancreas favoring cyst.  Other possibilities including postinflammatory cystic lesion or IPMN.  Pancreatic protocol MRI or CT recommended in 2 years.  Noted to have right lower lobe pulmonary nodule will require surveillance study (noncontrast chest CT) in 6 to 12 months.  She reports several colonoscopies at [facility], never had any polyps.  States she "aged out".  No prior EGD.  Presents today feeling well.  Denies any abdominal pain.  Appetite is too good.  No nausea or vomiting.  No heartburn.  Bowel movements are regular.  No blood in the stool or melena.  Denies confusion.  No edema.  Taking care of her 17 year old mother during the day.  Husband recently had to start dialysis.  Current Outpatient Medications  Medication Sig Dispense Refill  . aspirin  EC 81 MG tablet Take 81 mg by mouth daily. Swallow whole.    Marland Kitchen atorvastatin (LIPITOR) 10 MG tablet Take 1 tablet by mouth daily.    Marland Kitchen docusate sodium (COLACE) 100 MG capsule Take 200 mg by mouth daily.    . Dulaglutide (TRULICITY) 1.5 MG/0.5ML SOPN Inject into the skin every 7 (seven) days.    Marland Kitchen levothyroxine (SYNTHROID) 50 MCG tablet Take 1 tablet by mouth daily.    Marland Kitchen lisinopril (ZESTRIL) 2.5 MG tablet Take 1 tablet by mouth daily.    . Multiple Vitamins-Minerals (CENTRUM SILVER 50+WOMEN) TABS Take 1 tablet by mouth daily.    . Omega-3 Fatty Acids (FISH OIL) 1200 MG CAPS Take 2 capsules by mouth daily.     No current facility-administered medications for this visit.    Allergies as of 11/19/2020  . (No Known Allergies)    ROS:  General: Negative for anorexia, weight loss, fever, chills, fatigue, weakness. ENT: Negative for hoarseness, difficulty swallowing , nasal congestion. CV: Negative for chest pain, angina, palpitations, dyspnea on exertion, peripheral edema.  Respiratory: Negative for dyspnea at rest, dyspnea on exertion, cough, sputum, wheezing.  GI: See history of present illness. GU:  Negative for dysuria, hematuria, urinary incontinence, urinary frequency, nocturnal urination.  Endo: Negative for unusual weight change.    Physical Examination:   BP 138/87   Pulse 76   Temp (!) 96.6 F (35.9 C) (Temporal)   Ht 5\' 5"  (1.651 m)   Wt 166 lb  6.4 oz (75.5 kg)   BMI 27.69 kg/m   General: Well-nourished, well-developed in no acute distress.  Eyes: No icterus. Mouth: masked Lungs: Clear to auscultation bilaterally.  Heart: Regular rate and rhythm, no murmurs rubs or gallops.  Abdomen: Bowel sounds are normal, nontender, nondistended, no hepatosplenomegaly or masses, no abdominal bruits or hernia , no rebound or guarding.   Extremities: No lower extremity edema. No clubbing or deformities. Neuro: Alert and oriented x 4   Skin: Warm and dry, no jaundice.   Psych: Alert  and cooperative, normal mood and affect.  Labs:  Lab Results  Component Value Date   ALT 51 (H) 05/03/2020   AST 35 05/03/2020   ALKPHOS 61 05/03/2020   BILITOT 0.9 05/03/2020   Lab Results  Component Value Date   CREATININE 0.90 06/07/2020   BUN 18 03/06/2020   NA 138 03/06/2020   K 4.2 03/06/2020   CL 103 03/06/2020   CO2 23 03/06/2020    Lab Results  Component Value Date   INR 1.1 03/06/2020      Lab Results  Component Value Date   IRON 110 03/06/2020   TIBC 306 03/06/2020   FERRITIN 265 05/03/2020    Imaging Studies: See above   Assessment:  Pleasant 75 year old female with history of diabetes, hyperlipidemia, obesity, cirrhosis, pancreatic lesion presenting for follow-up.   Cirrhosis:  initially found to have possible cirrhosis on ultrasound.  She had CT back in December with nodular cirrhotic appearing liver, normal spleen.  Platelets have been normal.  No evidence of portal hypertension.  Suspected [place].  She is aware that she is not immune to hepatitis B and vaccinations have been recommended.  Previously had been undecided whether to pursue, advised that she can follow-up with PCP or health department.  Continue cirrhosis care.  Pancreatic lesion: Noted on ultrasound initially.  Follow-up CT back in December, favored cyst but cannot exclude other etiologies such as IPMN.  We will plan for surveillance study in 1 year.  If stable at that time about a 2-year surveillance.  Right lower lobe pulmonary nodule: Incidentally found on CT in December, plans for 6 months follow-up noncontrast chest CT.  Plan: 1. RUQ U/S 2. Labs 3. We will perform surveillance chest CT for pulmonary nodule and CT versus MRI pancreatic protocol as outlined above when timing appropriate. 4. We discussed briefly future need for surveillance EGD for esophageal varices but at this time we will postpone till after her next visit.  Based on no evidence of portal  hypertension/splenomegaly/thrombocytopenia, she is low risk for esophageal varices at this time. 5. She will return to the office in 6 months or call sooner if needed.

## 2020-11-23 DIAGNOSIS — K862 Cyst of pancreas: Secondary | ICD-10-CM | POA: Diagnosis not present

## 2020-11-23 DIAGNOSIS — E063 Autoimmune thyroiditis: Secondary | ICD-10-CM | POA: Diagnosis not present

## 2020-11-23 DIAGNOSIS — I10 Essential (primary) hypertension: Secondary | ICD-10-CM | POA: Diagnosis not present

## 2020-11-23 DIAGNOSIS — E1129 Type 2 diabetes mellitus with other diabetic kidney complication: Secondary | ICD-10-CM | POA: Diagnosis not present

## 2020-11-23 DIAGNOSIS — Z0000 Encounter for general adult medical examination without abnormal findings: Secondary | ICD-10-CM | POA: Diagnosis not present

## 2020-11-23 DIAGNOSIS — E7849 Other hyperlipidemia: Secondary | ICD-10-CM | POA: Diagnosis not present

## 2020-11-23 DIAGNOSIS — N182 Chronic kidney disease, stage 2 (mild): Secondary | ICD-10-CM | POA: Diagnosis not present

## 2020-11-23 DIAGNOSIS — R911 Solitary pulmonary nodule: Secondary | ICD-10-CM | POA: Diagnosis not present

## 2020-11-23 DIAGNOSIS — Z1389 Encounter for screening for other disorder: Secondary | ICD-10-CM | POA: Diagnosis not present

## 2020-11-23 DIAGNOSIS — M25512 Pain in left shoulder: Secondary | ICD-10-CM | POA: Diagnosis not present

## 2020-11-23 DIAGNOSIS — K7460 Unspecified cirrhosis of liver: Secondary | ICD-10-CM | POA: Diagnosis not present

## 2020-11-24 LAB — CBC WITH DIFFERENTIAL/PLATELET
Basophils Absolute: 0.1 10*3/uL (ref 0.0–0.2)
Basos: 1 %
EOS (ABSOLUTE): 0.3 10*3/uL (ref 0.0–0.4)
Eos: 4 %
Hematocrit: 38.1 % (ref 34.0–46.6)
Hemoglobin: 13 g/dL (ref 11.1–15.9)
Immature Grans (Abs): 0 10*3/uL (ref 0.0–0.1)
Immature Granulocytes: 0 %
Lymphocytes Absolute: 1.2 10*3/uL (ref 0.7–3.1)
Lymphs: 17 %
MCH: 32.1 pg (ref 26.6–33.0)
MCHC: 34.1 g/dL (ref 31.5–35.7)
MCV: 94 fL (ref 79–97)
Monocytes Absolute: 1.1 10*3/uL — ABNORMAL HIGH (ref 0.1–0.9)
Monocytes: 16 %
Neutrophils Absolute: 4.4 10*3/uL (ref 1.4–7.0)
Neutrophils: 62 %
Platelets: 149 10*3/uL — ABNORMAL LOW (ref 150–450)
RBC: 4.05 x10E6/uL (ref 3.77–5.28)
RDW: 12 % (ref 11.7–15.4)
WBC: 7.1 10*3/uL (ref 3.4–10.8)

## 2020-11-24 LAB — COMPREHENSIVE METABOLIC PANEL WITH GFR
ALT: 55 IU/L — ABNORMAL HIGH (ref 0–32)
AST: 43 IU/L — ABNORMAL HIGH (ref 0–40)
Albumin/Globulin Ratio: 1.2 (ref 1.2–2.2)
Albumin: 4.2 g/dL (ref 3.7–4.7)
Alkaline Phosphatase: 94 IU/L (ref 44–121)
BUN/Creatinine Ratio: 15 (ref 12–28)
BUN: 15 mg/dL (ref 8–27)
Bilirubin Total: 0.7 mg/dL (ref 0.0–1.2)
CO2: 22 mmol/L (ref 20–29)
Calcium: 9.7 mg/dL (ref 8.7–10.3)
Chloride: 101 mmol/L (ref 96–106)
Creatinine, Ser: 1 mg/dL (ref 0.57–1.00)
Globulin, Total: 3.4 g/dL (ref 1.5–4.5)
Glucose: 131 mg/dL — ABNORMAL HIGH (ref 65–99)
Potassium: 4.3 mmol/L (ref 3.5–5.2)
Sodium: 140 mmol/L (ref 134–144)
Total Protein: 7.6 g/dL (ref 6.0–8.5)
eGFR: 59 mL/min/1.73 — ABNORMAL LOW

## 2020-11-24 LAB — AFP TUMOR MARKER: AFP, Serum, Tumor Marker: 4 ng/mL (ref 0.0–9.2)

## 2020-11-24 LAB — PROTIME-INR
INR: 1.1 (ref 0.9–1.2)
Prothrombin Time: 11 s (ref 9.1–12.0)

## 2020-11-27 ENCOUNTER — Ambulatory Visit (HOSPITAL_COMMUNITY)
Admission: RE | Admit: 2020-11-27 | Discharge: 2020-11-27 | Disposition: A | Discharge status: Home / Self Care | Payer: Medicare Other | Source: Ambulatory Visit | Attending: Gastroenterology | Admitting: Gastroenterology

## 2020-11-27 DIAGNOSIS — K7460 Unspecified cirrhosis of liver: Secondary | ICD-10-CM | POA: Insufficient documentation

## 2020-11-27 DIAGNOSIS — K862 Cyst of pancreas: Secondary | ICD-10-CM | POA: Diagnosis not present

## 2020-11-27 DIAGNOSIS — R911 Solitary pulmonary nodule: Secondary | ICD-10-CM | POA: Diagnosis not present

## 2020-12-14 DIAGNOSIS — Z23 Encounter for immunization: Secondary | ICD-10-CM | POA: Diagnosis not present

## 2020-12-16 IMAGING — US US ABDOMEN COMPLETE
1 series · 13 of 25 positions shown · non-contrast
Comparison: None.

CLINICAL DATA: Elevated liver enzymes

EXAM:
ABDOMEN ULTRASOUND COMPLETE

[Series 1: us abdomen complete · 13 of 90 slices shown]
[im 1/90]
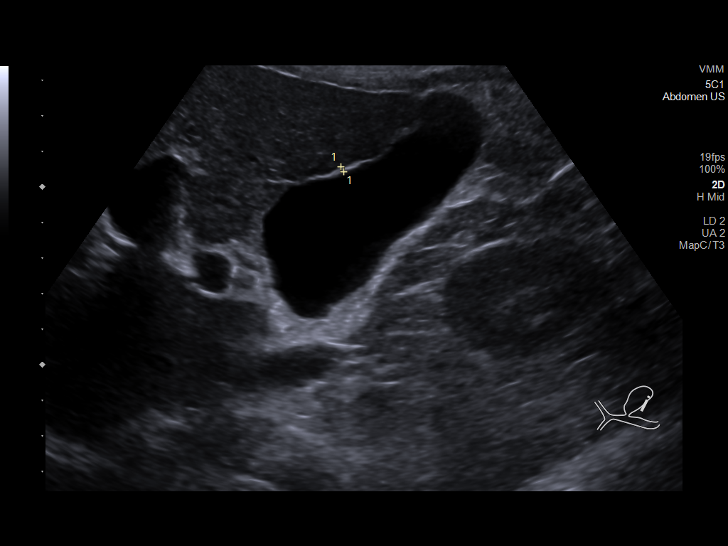
[im 8/90]
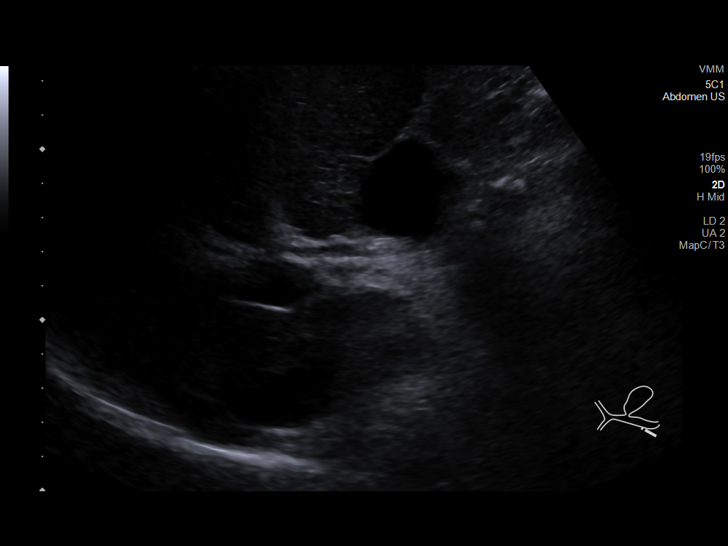
[im 15/90]
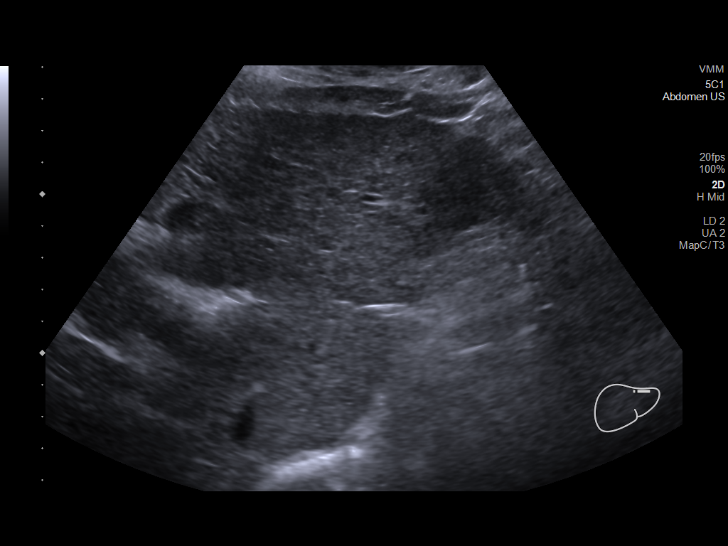
[im 23/90]
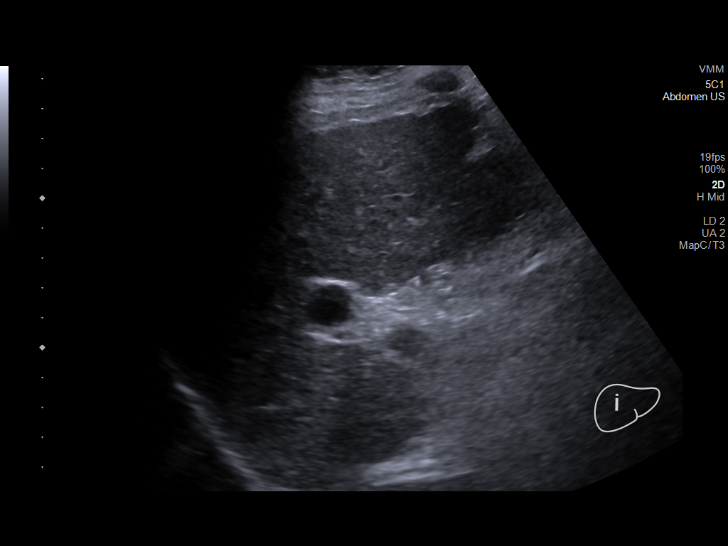
[im 30/90]
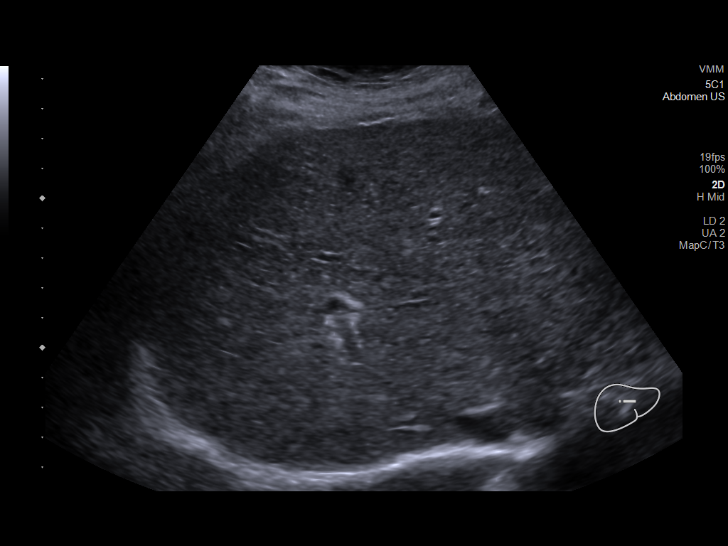
[im 38/90]
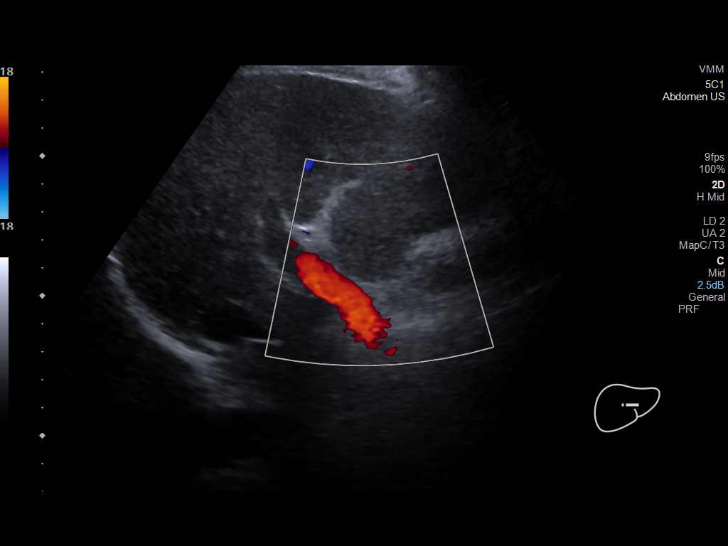
[im 45/90]
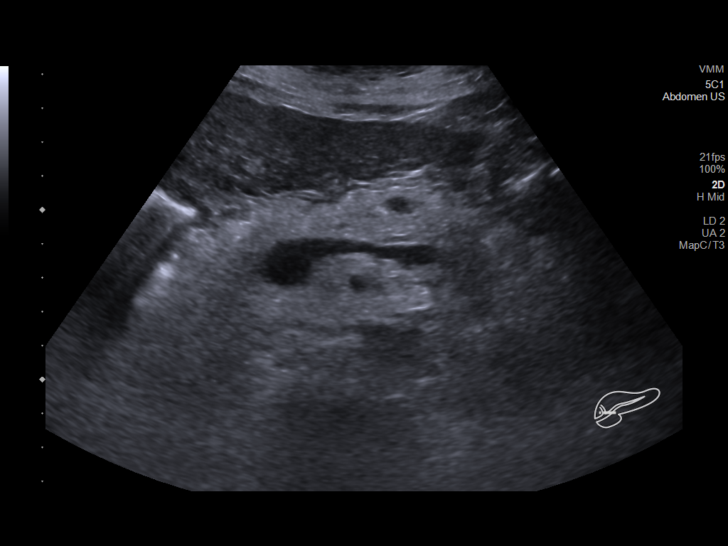
[im 52/90]
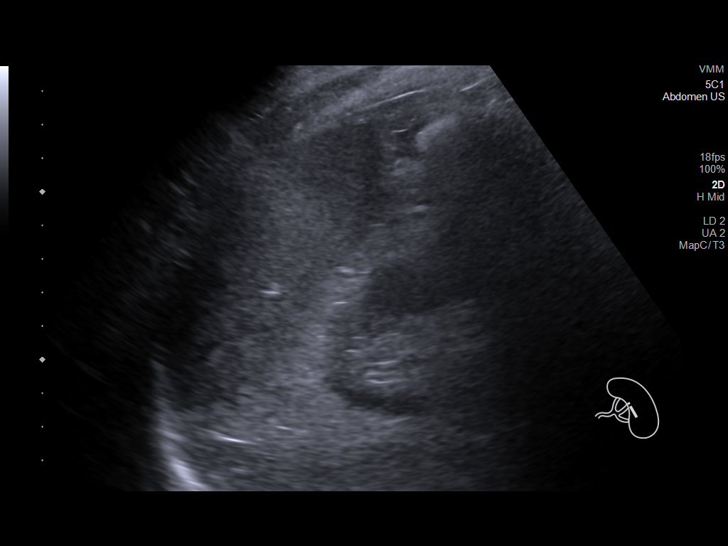
[im 60/90]
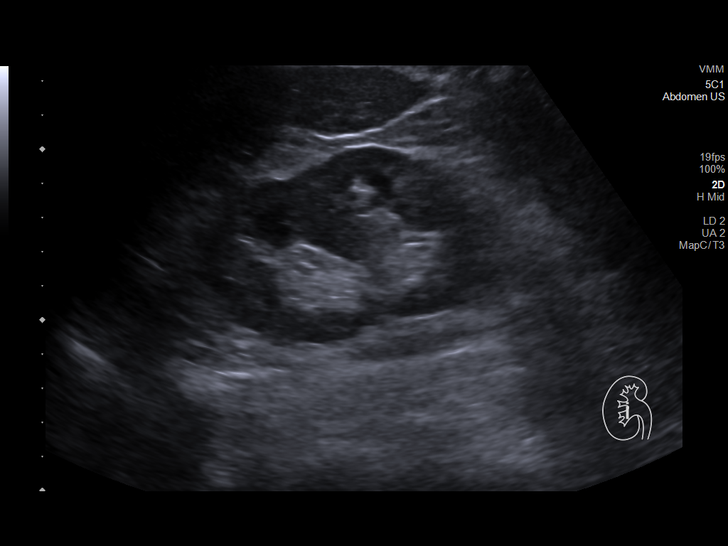
[im 67/90]
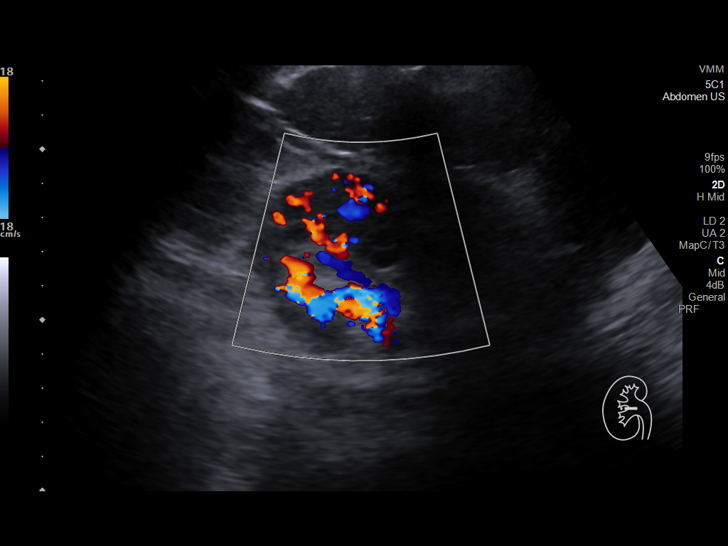
[im 75/90]
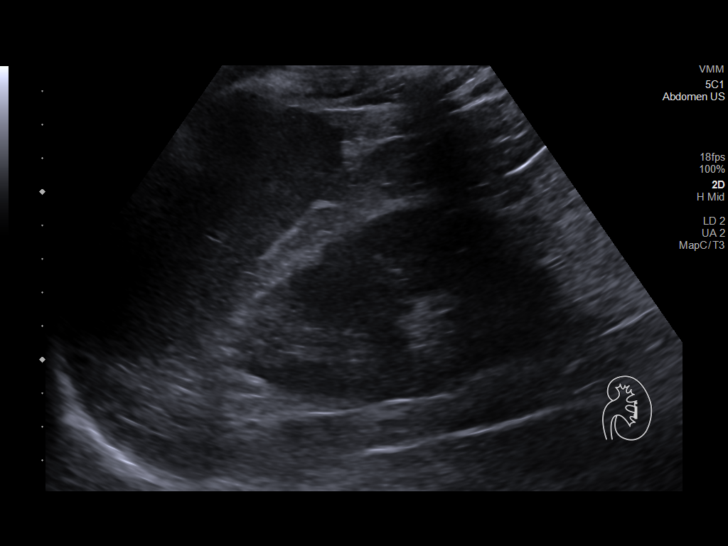
[im 82/90]
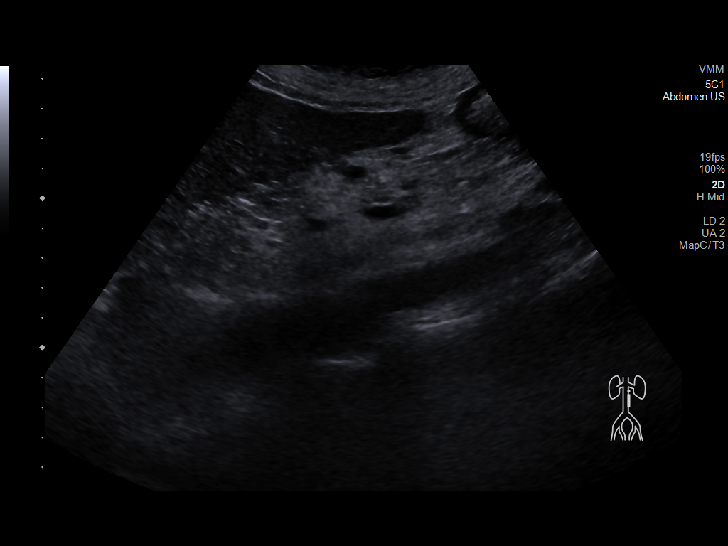
[im 90/90]
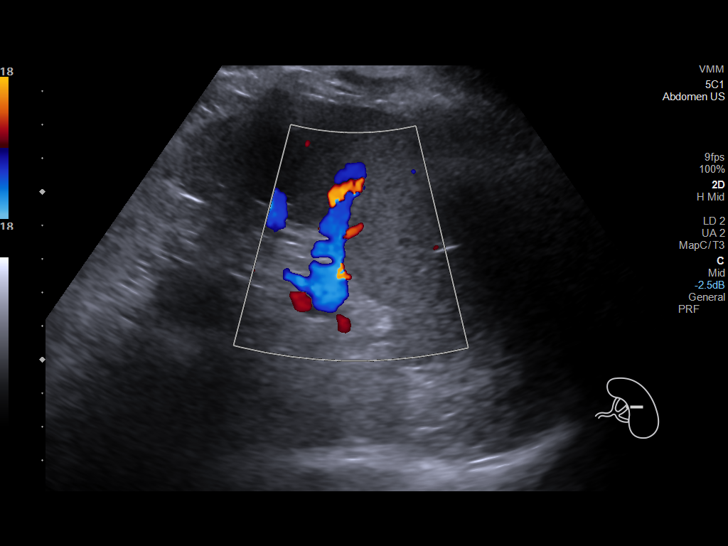

[13 of 25 positions shown; findings below may reference images not displayed]

FINDINGS: Gallbladder: No gallstones or wall thickening visualized. There is
no pericholecystic fluid. No sonographic Murphy sign noted by
sonographer.

Common bile duct: Diameter: 4 mm. No intrahepatic, common hepatic,
or common bile duct dilatation.

Liver: No focal lesion identified. Liver echogenicity is somewhat
nodular and coarse. Portal vein is patent on color Doppler imaging
with normal direction of blood flow towards the liver.

IVC: No abnormality visualized.

Pancreas: There is a cyst in the body of the pancreas measuring 7 x
4 mm. Pancreas otherwise appears normal.

Spleen: Size and appearance within normal limits.

Right Kidney: Length: 9.9 cm. Echogenicity within normal limits. No
mass or hydronephrosis visualized.

Left Kidney: Length: 10.8 cm. Echogenicity within normal limits. No
hydronephrosis visualized. There is a cyst arising from the upper to
mid left kidney measuring 0.9 x 0.9 x 0.8 cm.

Abdominal aorta: No aneurysm visualized.

Other findings: No demonstrable ascites.
IMPRESSION: 1. Liver echogenicity is coarse with a somewhat nodular contour.
This appearance is indicative of hepatic cirrhosis. No focal liver
lesions evident; it must be cautioned that the sensitivity of
ultrasound for detection of focal liver lesions is diminished in
this circumstance.

2. Subcentimeter cyst in body of pancreas. Pancreas otherwise
appears normal.

3.  Small cyst left kidney.

4.  Study otherwise unremarkable.

## 2020-12-24 ENCOUNTER — Telehealth: Payer: Self-pay | Admitting: Internal Medicine

## 2020-12-24 NOTE — Telephone Encounter (Signed)
Recall for ct non contrast

## 2020-12-24 NOTE — Telephone Encounter (Signed)
Recall placed

## 2021-05-03 DIAGNOSIS — Z23 Encounter for immunization: Secondary | ICD-10-CM | POA: Diagnosis not present

## 2021-05-14 ENCOUNTER — Other Ambulatory Visit (HOSPITAL_COMMUNITY): Payer: Self-pay | Admitting: Family Medicine

## 2021-05-14 ENCOUNTER — Encounter: Payer: Self-pay | Admitting: Internal Medicine

## 2021-05-14 ENCOUNTER — Telehealth: Payer: Self-pay | Admitting: Internal Medicine

## 2021-05-14 DIAGNOSIS — Z1231 Encounter for screening mammogram for malignant neoplasm of breast: Secondary | ICD-10-CM

## 2021-05-14 DIAGNOSIS — R911 Solitary pulmonary nodule: Secondary | ICD-10-CM

## 2021-05-14 NOTE — Telephone Encounter (Signed)
Pt received letter in June 2022 that it was time to schedule her CT. She called today wanting to schedule. Please call [phone]

## 2021-05-14 NOTE — Telephone Encounter (Signed)
Per 06/07/21 CT pelvis result note: "PLEASE NIC FOR  non contrast Chest CT in 6 months for right lower lobe lung nodule surveillance." ---  Called pt and made aware of CT chest appt scheduled for 12/1 at 2:30pm, arrival 2:15pm. She voiced understanding.

## 2021-05-17 DIAGNOSIS — E1129 Type 2 diabetes mellitus with other diabetic kidney complication: Secondary | ICD-10-CM | POA: Diagnosis not present

## 2021-05-17 DIAGNOSIS — I10 Essential (primary) hypertension: Secondary | ICD-10-CM | POA: Diagnosis not present

## 2021-05-17 DIAGNOSIS — E782 Mixed hyperlipidemia: Secondary | ICD-10-CM | POA: Diagnosis not present

## 2021-05-17 DIAGNOSIS — E063 Autoimmune thyroiditis: Secondary | ICD-10-CM | POA: Diagnosis not present

## 2021-05-20 ENCOUNTER — Other Ambulatory Visit: Payer: Self-pay

## 2021-05-20 ENCOUNTER — Ambulatory Visit (HOSPITAL_COMMUNITY)
Admission: RE | Admit: 2021-05-20 | Discharge: 2021-05-20 | Disposition: A | Discharge status: Home / Self Care | Payer: Medicare Other | Source: Ambulatory Visit | Attending: Family Medicine | Admitting: Family Medicine

## 2021-05-20 DIAGNOSIS — Z1231 Encounter for screening mammogram for malignant neoplasm of breast: Secondary | ICD-10-CM | POA: Insufficient documentation

## 2021-06-06 ENCOUNTER — Encounter (HOSPITAL_COMMUNITY): Payer: Self-pay

## 2021-06-06 ENCOUNTER — Ambulatory Visit (HOSPITAL_COMMUNITY): Admission: RE | Admit: 2021-06-06 | Payer: Medicare Other | Source: Ambulatory Visit

## 2021-06-25 ENCOUNTER — Ambulatory Visit (HOSPITAL_COMMUNITY): Payer: Medicare Other

## 2021-07-02 ENCOUNTER — Telehealth: Payer: Self-pay | Admitting: Internal Medicine

## 2021-07-02 NOTE — Telephone Encounter (Signed)
Per CT pelvis result note 06/2020 "PLEASE NIC FOR MRI abdomen pancreatic protocol for pancreatic cyst surveillance in 06/2021."

## 2021-07-02 NOTE — Telephone Encounter (Signed)
RECALL MRI PANCREATIC PROTOCOL

## 2021-07-15 ENCOUNTER — Ambulatory Visit (HOSPITAL_COMMUNITY)
Admission: RE | Admit: 2021-07-15 | Discharge: 2021-07-15 | Disposition: A | Discharge status: Home / Self Care | Payer: Medicare Other | Source: Ambulatory Visit | Attending: Gastroenterology | Admitting: Gastroenterology

## 2021-07-15 ENCOUNTER — Other Ambulatory Visit: Payer: Self-pay

## 2021-07-15 DIAGNOSIS — I700 Atherosclerosis of aorta: Secondary | ICD-10-CM | POA: Diagnosis not present

## 2021-07-15 DIAGNOSIS — R911 Solitary pulmonary nodule: Secondary | ICD-10-CM | POA: Insufficient documentation

## 2021-07-25 ENCOUNTER — Other Ambulatory Visit: Payer: Self-pay

## 2021-07-25 DIAGNOSIS — R911 Solitary pulmonary nodule: Secondary | ICD-10-CM

## 2021-07-25 DIAGNOSIS — R7989 Other specified abnormal findings of blood chemistry: Secondary | ICD-10-CM

## 2021-07-25 DIAGNOSIS — K7460 Unspecified cirrhosis of liver: Secondary | ICD-10-CM

## 2021-07-29 ENCOUNTER — Encounter: Payer: Self-pay | Admitting: Internal Medicine

## 2021-07-31 DIAGNOSIS — K7460 Unspecified cirrhosis of liver: Secondary | ICD-10-CM | POA: Diagnosis not present

## 2021-07-31 DIAGNOSIS — R7989 Other specified abnormal findings of blood chemistry: Secondary | ICD-10-CM | POA: Diagnosis not present

## 2021-07-31 DIAGNOSIS — R911 Solitary pulmonary nodule: Secondary | ICD-10-CM | POA: Diagnosis not present

## 2021-08-01 LAB — CBC WITH DIFFERENTIAL/PLATELET
Basophils Absolute: 0.1 10*3/uL (ref 0.0–0.2)
Basos: 1 %
EOS (ABSOLUTE): 0.3 10*3/uL (ref 0.0–0.4)
Eos: 3 %
Hematocrit: 34.7 % (ref 34.0–46.6)
Hemoglobin: 11.8 g/dL (ref 11.1–15.9)
Immature Grans (Abs): 0 10*3/uL (ref 0.0–0.1)
Immature Granulocytes: 0 %
Lymphocytes Absolute: 1.8 10*3/uL (ref 0.7–3.1)
Lymphs: 23 %
MCH: 32.3 pg (ref 26.6–33.0)
MCHC: 34 g/dL (ref 31.5–35.7)
MCV: 95 fL (ref 79–97)
Monocytes Absolute: 0.7 10*3/uL (ref 0.1–0.9)
Monocytes: 10 %
Neutrophils Absolute: 4.7 10*3/uL (ref 1.4–7.0)
Neutrophils: 63 %
Platelets: 158 10*3/uL (ref 150–450)
RBC: 3.65 x10E6/uL — ABNORMAL LOW (ref 3.77–5.28)
RDW: 12.2 % (ref 11.7–15.4)
WBC: 7.5 10*3/uL (ref 3.4–10.8)

## 2021-08-01 LAB — COMPREHENSIVE METABOLIC PANEL
ALT: 63 IU/L — ABNORMAL HIGH (ref 0–32)
AST: 50 IU/L — ABNORMAL HIGH (ref 0–40)
Albumin/Globulin Ratio: 1.5 (ref 1.2–2.2)
Albumin: 4 g/dL (ref 3.7–4.7)
Alkaline Phosphatase: 94 IU/L (ref 44–121)
BUN/Creatinine Ratio: 21 (ref 12–28)
BUN: 20 mg/dL (ref 8–27)
Bilirubin Total: 0.6 mg/dL (ref 0.0–1.2)
CO2: 22 mmol/L (ref 20–29)
Calcium: 9.3 mg/dL (ref 8.7–10.3)
Chloride: 108 mmol/L — ABNORMAL HIGH (ref 96–106)
Creatinine, Ser: 0.97 mg/dL (ref 0.57–1.00)
Globulin, Total: 2.7 g/dL (ref 1.5–4.5)
Glucose: 148 mg/dL — ABNORMAL HIGH (ref 70–99)
Potassium: 4.2 mmol/L (ref 3.5–5.2)
Sodium: 144 mmol/L (ref 134–144)
Total Protein: 6.7 g/dL (ref 6.0–8.5)
eGFR: 61 mL/min/{1.73_m2} (ref >59)

## 2021-08-01 LAB — PROTIME-INR
INR: 1.1 (ref 0.9–1.2)
Prothrombin Time: 11.6 s (ref 9.1–12.0)

## 2021-08-01 LAB — AFP TUMOR MARKER: AFP, Serum, Tumor Marker: 4.7 ng/mL (ref 0.0–9.2)

## 2021-08-20 ENCOUNTER — Encounter: Payer: Self-pay | Admitting: Internal Medicine

## 2021-11-27 ENCOUNTER — Encounter: Payer: Self-pay | Admitting: Gastroenterology

## 2021-11-27 ENCOUNTER — Ambulatory Visit: Payer: Medicare Other | Admitting: Gastroenterology

## 2021-11-27 ENCOUNTER — Encounter: Payer: Self-pay | Admitting: *Deleted

## 2021-11-27 VITALS — BP 138/84 | HR 79 | Temp 97.3°F | Ht 65.0 in | Wt 161.8 lb

## 2021-11-27 DIAGNOSIS — R7989 Other specified abnormal findings of blood chemistry: Secondary | ICD-10-CM

## 2021-11-27 DIAGNOSIS — K862 Cyst of pancreas: Secondary | ICD-10-CM

## 2021-11-27 DIAGNOSIS — K7460 Unspecified cirrhosis of liver: Secondary | ICD-10-CM

## 2021-11-27 NOTE — Patient Instructions (Signed)
Please have labs done at [facility] tomorrow.  CT scan of liver and pancreas as planned. Return to the office in six months or call sooner if needed.

## 2021-11-27 NOTE — Progress Notes (Signed)
GI Office Note    Referring Provider: Avis Epley, PA* Primary Care Physician:  Ladon Applebaum  Primary Gastroenterologist: Roetta Sessions, MD   Chief Complaint   Chief Complaint  Patient presents with   Follow-up    No current issues to discuss.     History of Present Illness   Jaime Matthews is a 76 y.o. female presenting today for follow-up.  She was last seen in May 2022.  She has a history of abnormal LFTs with previous ultrasound showing somewhat nodular and coarse liver concerning for cirrhosis.  Also with cyst in the body of the pancreas measuring 7 x 4 mm on ultrasound.  Previous work-up showed no evidence of autoimmune hepatitis/PBC.  She is immune to hepatitis A but not to hepatitis B.  Vaccinations previously recommended.  Ferritin previously elevated in the 400 range with normal iron saturations, improved to 265 on repeat.  HFE genetic markers negative.  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  CT in 2021 with nodular cirrhotic contour of the liver noted.  0.7 x 0.6 x 0.6 cm fluid density lesion in the body of the pancreas favoring cyst.  Pancreatic protocol MRI or CT recommended in 2 years.  Also with right lower lobe pulmonary nodule with recommendations for 6 to 8-month follow-up noncontrast chest CT.  She reports several colonoscopies at [facility], never had any polyps.  States she "aged out". No prior EGD.  We discussed surveillance EGD for esophageal varices at last office visit, we decided to postpone for later time, given no evidence of portal hypertension she is at low risk for esophageal varices at this time.  Last labs done in January 2023, MELD 7, no anemia or thrombocytopenia.  AFP tumor marker normal.  AST/ALT remain minimally elevated without significant change.  January 2023, CT chest without contrast showed stable 9X89mm right lower lobe pulmonary nodule, likely benign, Chest CT in 12 months recommended.  Today: weight down 16  pounds. Her mom passed, her husband had to have heart valve replacement. He is also on dialysis. Contribute weight loss to stress and Trulicity. BM regular with use of Colace, prunes. No melena, brbpr. No heartburn. No abdominal pain, heartburn. Feels well.   Medications   Current Outpatient Medications  Medication Sig Dispense Refill   aspirin EC 81 MG tablet Take 81 mg by mouth daily. Swallow whole.     atorvastatin (LIPITOR) 10 MG tablet Take 1 tablet by mouth daily.     docusate sodium (COLACE) 100 MG capsule Take 200 mg by mouth daily.     Dulaglutide (TRULICITY) 1.5 MG/0.5ML SOPN Inject into the skin every 7 (seven) days.     levothyroxine (SYNTHROID) 50 MCG tablet Take 1 tablet by mouth daily.     lisinopril (ZESTRIL) 2.5 MG tablet Take 1 tablet by mouth daily.     Multiple Vitamins-Minerals (CENTRUM SILVER 50+WOMEN) TABS Take 1 tablet by mouth daily.     Omega-3 Fatty Acids (FISH OIL) 1200 MG CAPS Take 2 capsules by mouth daily.     No current facility-administered medications for this visit.    Allergies   Allergies as of 11/27/2021   (No Known Allergies)       Review of Systems   General: Negative for anorexia, weight loss, fever, chills, fatigue, weakness. ENT: Negative for hoarseness, difficulty swallowing , nasal congestion. CV: Negative for chest pain, angina, palpitations, dyspnea on exertion, peripheral edema.  Respiratory: Negative for dyspnea at rest, dyspnea  on exertion, cough, sputum, wheezing.  GI: See history of present illness. GU:  Negative for dysuria, hematuria, urinary incontinence, urinary frequency, nocturnal urination.  Endo: Negative for unusual weight change.     Physical Exam   BP 138/84 (BP Location: Left Arm, Patient Position: Sitting, Cuff Size: Normal)   Pulse 79   Temp (!) 97.3 F (36.3 C) (Temporal)   Ht 5\' 5"  (1.651 m)   Wt 161 lb 12.8 oz (73.4 kg)   SpO2 98%   BMI 26.92 kg/m    General: Well-nourished, well-developed in no acute  distress.  Eyes: No icterus. Mouth: Oropharyngeal mucosa moist and pink , no lesions erythema or exudate. Lungs: Clear to auscultation bilaterally.  Heart: Regular rate and rhythm, no murmurs rubs or gallops.  Abdomen: Bowel sounds are normal, nontender, nondistended, no hepatosplenomegaly or masses,  no abdominal bruits or hernia , no rebound or guarding.  Rectal: not performed Extremities: No lower extremity edema. No clubbing or deformities. Neuro: Alert and oriented x 4   Skin: Warm and dry, no jaundice.   Psych: Alert and cooperative, normal mood and affect.  Labs    Imaging Studies   No results found.  Assessment   Cirrhosis: Has been well compensated.  No evidence of portal hypertension on imaging.  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  Previously advised to have hepatitis B vaccinations, has been reluctant to pursuing.  Due for labs and imaging. Hold off on EGD at this time.   Pancreatic lesion: Noted on ultrasound initially.  Follow-up CT was done in December 2021, favored cyst but cannot exclude other etiologies such as IPMN.  Continue ongoing surveillance.  Due for imaging at this time.  CT or MRI pancreatic protocol, patient is significantly claustrophobic and prefers CT.  Right lower lobe pulmonary nodule: Incidentally found on CT in December.  Surveillance CT completed January 2023, stable 9 x 7 mm right lower lobe pulmonary nodule felt to be benign but recommending follow-up chest CT in 12 months.   PLAN   Labs planned.   CT of abdomen for hepatoma screening and surveillance of pancreatic cyst.  Pancreatic protocol for pancreatic cyst.  Chest CT planned for January 2024. Return to office in six months.   February 2024. Leanna Battles, MHS, PA-C [facility]

## 2021-11-28 DIAGNOSIS — K7460 Unspecified cirrhosis of liver: Secondary | ICD-10-CM | POA: Diagnosis not present

## 2021-11-28 DIAGNOSIS — E1129 Type 2 diabetes mellitus with other diabetic kidney complication: Secondary | ICD-10-CM | POA: Diagnosis not present

## 2021-11-28 DIAGNOSIS — H6981 Other specified disorders of Eustachian tube, right ear: Secondary | ICD-10-CM | POA: Diagnosis not present

## 2021-11-28 DIAGNOSIS — R200 Anesthesia of skin: Secondary | ICD-10-CM | POA: Diagnosis not present

## 2021-11-28 DIAGNOSIS — R7989 Other specified abnormal findings of blood chemistry: Secondary | ICD-10-CM | POA: Diagnosis not present

## 2021-11-28 DIAGNOSIS — Z0001 Encounter for general adult medical examination with abnormal findings: Secondary | ICD-10-CM | POA: Diagnosis not present

## 2021-11-28 DIAGNOSIS — I10 Essential (primary) hypertension: Secondary | ICD-10-CM | POA: Diagnosis not present

## 2021-11-28 DIAGNOSIS — E063 Autoimmune thyroiditis: Secondary | ICD-10-CM | POA: Diagnosis not present

## 2021-11-28 DIAGNOSIS — E782 Mixed hyperlipidemia: Secondary | ICD-10-CM | POA: Diagnosis not present

## 2021-11-28 DIAGNOSIS — K862 Cyst of pancreas: Secondary | ICD-10-CM | POA: Diagnosis not present

## 2021-11-29 LAB — CBC WITH DIFFERENTIAL/PLATELET
Basophils Absolute: 0.1 10*3/uL (ref 0.0–0.2)
Basos: 1 %
EOS (ABSOLUTE): 0.2 10*3/uL (ref 0.0–0.4)
Eos: 3 %
Hematocrit: 39.6 % (ref 34.0–46.6)
Hemoglobin: 13.4 g/dL (ref 11.1–15.9)
Immature Grans (Abs): 0 10*3/uL (ref 0.0–0.1)
Immature Granulocytes: 0 %
Lymphocytes Absolute: 1.9 10*3/uL (ref 0.7–3.1)
Lymphs: 27 %
MCH: 32 pg (ref 26.6–33.0)
MCHC: 33.8 g/dL (ref 31.5–35.7)
MCV: 95 fL (ref 79–97)
Monocytes Absolute: 0.6 10*3/uL (ref 0.1–0.9)
Monocytes: 9 %
Neutrophils Absolute: 4.1 10*3/uL (ref 1.4–7.0)
Neutrophils: 60 %
Platelets: 161 10*3/uL (ref 150–450)
RBC: 4.19 x10E6/uL (ref 3.77–5.28)
RDW: 12.4 % (ref 11.7–15.4)
WBC: 6.9 10*3/uL (ref 3.4–10.8)

## 2021-11-29 LAB — COMPREHENSIVE METABOLIC PANEL
ALT: 66 IU/L — ABNORMAL HIGH (ref 0–32)
AST: 57 IU/L — ABNORMAL HIGH (ref 0–40)
Albumin/Globulin Ratio: 1.3 (ref 1.2–2.2)
Albumin: 4.6 g/dL (ref 3.7–4.7)
Alkaline Phosphatase: 117 IU/L (ref 44–121)
BUN/Creatinine Ratio: 18 (ref 12–28)
BUN: 18 mg/dL (ref 8–27)
Bilirubin Total: 0.8 mg/dL (ref 0.0–1.2)
CO2: 22 mmol/L (ref 20–29)
Calcium: 9.9 mg/dL (ref 8.7–10.3)
Chloride: 105 mmol/L (ref 96–106)
Creatinine, Ser: 0.99 mg/dL (ref 0.57–1.00)
Globulin, Total: 3.5 g/dL (ref 1.5–4.5)
Glucose: 105 mg/dL — ABNORMAL HIGH (ref 70–99)
Potassium: 4.4 mmol/L (ref 3.5–5.2)
Sodium: 142 mmol/L (ref 134–144)
Total Protein: 8.1 g/dL (ref 6.0–8.5)
eGFR: 59 mL/min/{1.73_m2} — ABNORMAL LOW (ref >59)

## 2021-11-29 LAB — PROTIME-INR
INR: 1.1 (ref 0.9–1.2)
Prothrombin Time: 11.3 s (ref 9.1–12.0)

## 2021-11-29 LAB — AFP TUMOR MARKER: AFP, Serum, Tumor Marker: 5.5 ng/mL (ref 0.0–9.2)

## 2021-12-09 ENCOUNTER — Other Ambulatory Visit: Payer: Self-pay

## 2021-12-09 DIAGNOSIS — R7989 Other specified abnormal findings of blood chemistry: Secondary | ICD-10-CM

## 2021-12-09 DIAGNOSIS — K7460 Unspecified cirrhosis of liver: Secondary | ICD-10-CM

## 2021-12-09 DIAGNOSIS — R911 Solitary pulmonary nodule: Secondary | ICD-10-CM

## 2021-12-09 DIAGNOSIS — K869 Disease of pancreas, unspecified: Secondary | ICD-10-CM

## 2021-12-09 NOTE — Addendum Note (Signed)
Addended by: Zada Finders on: 12/09/2021 08:28 AM   Modules accepted: Orders

## 2021-12-30 ENCOUNTER — Ambulatory Visit (HOSPITAL_COMMUNITY)
Admission: RE | Admit: 2021-12-30 | Discharge: 2021-12-30 | Disposition: A | Discharge status: Home / Self Care | Payer: Medicare Other | Source: Ambulatory Visit | Attending: Gastroenterology | Admitting: Gastroenterology

## 2021-12-30 ENCOUNTER — Encounter (HOSPITAL_COMMUNITY): Payer: Self-pay

## 2021-12-30 DIAGNOSIS — K7460 Unspecified cirrhosis of liver: Secondary | ICD-10-CM | POA: Diagnosis not present

## 2021-12-30 DIAGNOSIS — K6389 Other specified diseases of intestine: Secondary | ICD-10-CM | POA: Diagnosis not present

## 2021-12-30 DIAGNOSIS — K862 Cyst of pancreas: Secondary | ICD-10-CM | POA: Diagnosis not present

## 2021-12-30 DIAGNOSIS — K766 Portal hypertension: Secondary | ICD-10-CM | POA: Diagnosis not present

## 2021-12-30 DIAGNOSIS — R7989 Other specified abnormal findings of blood chemistry: Secondary | ICD-10-CM | POA: Diagnosis not present

## 2021-12-30 LAB — POCT I-STAT CREATININE: Creatinine, Ser: 1 mg/dL (ref 0.44–1.00)

## 2021-12-30 MED ORDER — IOHEXOL 350 MG/ML SOLN
100.0000 mL | Freq: Once | INTRAVENOUS | Status: AC | PRN
Start: 2021-12-30 — End: 2021-12-30
  Administered 2021-12-30: 80 mL via INTRAVENOUS

## 2022-01-10 DIAGNOSIS — K7460 Unspecified cirrhosis of liver: Secondary | ICD-10-CM | POA: Diagnosis not present

## 2022-01-10 DIAGNOSIS — K869 Disease of pancreas, unspecified: Secondary | ICD-10-CM | POA: Diagnosis not present

## 2022-01-10 DIAGNOSIS — R911 Solitary pulmonary nodule: Secondary | ICD-10-CM | POA: Diagnosis not present

## 2022-01-10 DIAGNOSIS — R7989 Other specified abnormal findings of blood chemistry: Secondary | ICD-10-CM | POA: Diagnosis not present

## 2022-01-12 LAB — MITOCHONDRIAL ANTIBODIES: Mitochondrial Ab: <20 Units (ref 0.0–20.0)

## 2022-01-12 LAB — IGG, IGA, IGM
IgA/Immunoglobulin A, Serum: 440 mg/dL — ABNORMAL HIGH (ref 64–422)
IgG (Immunoglobin G), Serum: 1243 mg/dL (ref 586–1602)
IgM (Immunoglobulin M), Srm: 57 mg/dL (ref 26–217)

## 2022-01-12 LAB — ANA: Anti Nuclear Antibody (ANA): NEGATIVE

## 2022-01-12 LAB — ANTI-MICROSOMAL ANTIBODY LIVER / KIDNEY: LKM1 Ab: 0.8 Units (ref 0.0–20.0)

## 2022-01-12 LAB — ANTI-SMOOTH MUSCLE ANTIBODY, IGG: Smooth Muscle Ab: 9 Units (ref 0–19)

## 2022-01-12 LAB — TISSUE TRANSGLUTAMINASE, IGA: Transglutaminase IgA: <2 U/mL (ref 0–3)

## 2022-05-28 DIAGNOSIS — Z7984 Long term (current) use of oral hypoglycemic drugs: Secondary | ICD-10-CM | POA: Diagnosis not present

## 2022-05-28 DIAGNOSIS — H2513 Age-related nuclear cataract, bilateral: Secondary | ICD-10-CM | POA: Diagnosis not present

## 2022-05-28 DIAGNOSIS — E119 Type 2 diabetes mellitus without complications: Secondary | ICD-10-CM | POA: Diagnosis not present

## 2022-06-03 ENCOUNTER — Encounter: Payer: Self-pay | Admitting: Gastroenterology

## 2022-06-03 ENCOUNTER — Telehealth: Payer: Self-pay | Admitting: *Deleted

## 2022-06-03 ENCOUNTER — Ambulatory Visit: Payer: Medicare Other | Admitting: Gastroenterology

## 2022-06-03 VITALS — BP 147/87 | HR 62 | Temp 98.0°F | Ht 65.5 in | Wt 168.6 lb

## 2022-06-03 DIAGNOSIS — R7989 Other specified abnormal findings of blood chemistry: Secondary | ICD-10-CM | POA: Diagnosis not present

## 2022-06-03 DIAGNOSIS — K7460 Unspecified cirrhosis of liver: Secondary | ICD-10-CM

## 2022-06-03 DIAGNOSIS — K862 Cyst of pancreas: Secondary | ICD-10-CM | POA: Diagnosis not present

## 2022-06-03 NOTE — Patient Instructions (Addendum)
Please complete labs and ultrasound.  Please follow up with your PCP regarding elevated blood pressures.

## 2022-06-03 NOTE — Progress Notes (Signed)
GI Office Note    Referring Provider: Avis Epley, PA* Primary Care Physician:  Ladon Applebaum  Primary Gastroenterologist: Roetta Sessions, MD   Chief Complaint   Chief Complaint  Patient presents with   Follow-up    Doing well, no issues.    History of Present Illness   Jaime Matthews is a 76 y.o. female presenting today for follow-up.  Last seen in May 2023.  She has a history of abnormal LFTs with previous ultrasound showing somewhat nodular and coarse liver concerning for cirrhosis. Also with cyst in the body of the pancreas measuring 7 x 4 mm on ultrasound. Previous work-up showed no evidence of autoimmune hepatitis/PBC. She is immune to hepatitis A but not to hepatitis B. Vaccinations previously recommended. Ferritin previously elevated in the 400 range with normal iron saturations, improved to 265 on repeat. HFE genetic markers negative. 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   CT in 2021 with nodular cirrhotic contour of the liver noted. 0.7 x 0.6 x 0.6 cm fluid density lesion in the body of the pancreas favoring cyst. Pancreatic protocol MRI or CT recommended in 2 years. Also with right lower lobe pulmonary nodule with recommendations for 6 to 95-month follow-up noncontrast chest CT.   CT abdomen with and without contrast June 2023: Cirrhotic liver, no suspicious liver lesions.  Small cystic lesion of the body of the pancreas measuring 7 mm, unchanged in size compared to 2021 study.  Spleen normal in size.  MRI/MRCP or pancreatic protocol CT recommended in 2 years for surveillance of pancreatic lesion.  She also had mild wall thickening and fat stranding of the right colon, likely due to portal colopathy, colitis or other lesion cannot be excluded.  Colonoscopy was offered but declined.  Labs for celiac, autoimmune liver processes were negative.  Today: feeling well. No abdominal pain. No edema. BMs regular. No melena, brbpr. No heartburn. Appetite normal.     Medications   Current Outpatient Medications  Medication Sig Dispense Refill   aspirin EC 81 MG tablet Take 81 mg by mouth daily. Swallow whole.     atorvastatin (LIPITOR) 10 MG tablet Take 1 tablet by mouth daily.     docusate sodium (COLACE) 100 MG capsule Take 200 mg by mouth daily.     Dulaglutide (TRULICITY) 1.5 MG/0.5ML SOPN Inject into the skin every 7 (seven) days.     levothyroxine (SYNTHROID) 50 MCG tablet Take 1 tablet by mouth daily.     lisinopril (ZESTRIL) 2.5 MG tablet Take 1 tablet by mouth daily.     Multiple Vitamins-Minerals (CENTRUM SILVER 50+WOMEN) TABS Take 1 tablet by mouth daily.     Omega-3 Fatty Acids (FISH OIL) 1200 MG CAPS Take 2 capsules by mouth daily.     ONETOUCH ULTRA test strip USE TO TEST BLOODSUGAR TWICE DAILY     No current facility-administered medications for this visit.    Allergies   Allergies as of 06/03/2022   (No Known Allergies)        Review of Systems   General: Negative for anorexia, weight loss, fever, chills, fatigue, weakness. ENT: Negative for hoarseness, difficulty swallowing , nasal congestion. CV: Negative for chest pain, angina, palpitations, dyspnea on exertion, peripheral edema.  Respiratory: Negative for dyspnea at rest, dyspnea on exertion, cough, sputum, wheezing.  GI: See history of present illness. GU:  Negative for dysuria, hematuria, urinary incontinence, urinary frequency, nocturnal urination.  Endo: Negative for unusual weight change.  Physical Exam   BP (!) 143/76 (BP Location: Right Arm, Patient Position: Sitting, Cuff Size: Normal)   Pulse 61   Temp 98 F (36.7 C) (Oral)   Ht 5' 5.5" (1.664 m)   Wt 168 lb 9.6 oz (76.5 kg)   SpO2 99%   BMI 27.63 kg/m    General: Well-nourished, well-developed in no acute distress.  Eyes: No icterus. Mouth: Oropharyngeal mucosa moist and pink , no lesions erythema or exudate.  Abdomen: Bowel sounds are normal, nontender, nondistended, no hepatosplenomegaly or  masses,  no abdominal bruits or hernia , no rebound or guarding.  Rectal: not performed  Extremities: No lower extremity edema. No clubbing or deformities. Neuro: Alert and oriented x 4   Skin: Warm and dry, no jaundice.   Psych: Alert and cooperative, normal mood and affect.  Labs   Lab Results  Component Value Date   CREATININE 1.00 12/30/2021   BUN 18 11/28/2021   NA 142 11/28/2021   K 4.4 11/28/2021   CL 105 11/28/2021   CO2 22 11/28/2021   Lab Results  Component Value Date   ALT 66 (H) 11/28/2021   AST 57 (H) 11/28/2021   ALKPHOS 117 11/28/2021   BILITOT 0.8 11/28/2021   Lab Results  Component Value Date   WBC 6.9 11/28/2021   HGB 13.4 11/28/2021   HCT 39.6 11/28/2021   MCV 95 11/28/2021   PLT 161 11/28/2021   Lab Results  Component Value Date   INR 1.1 11/28/2021   INR 1.1 07/31/2021   INR 1.1 11/23/2020    Imaging Studies   No results found.  Assessment   Cirrhosis: Has been well compensated.  No evidence of portal hypertension on imaging. 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  Previously advised to have hepatitis B vaccinations, has been reluctant to pursuing.  Due for labs and imaging. Hold off on EGD at this time given well compensated disease, normal platelets and no CT evidence of portal hypertension.    Pancreatic lesion: Noted on ultrasound initially.  Follow-up CT was done in December 2021, favored cyst but cannot exclude other etiologies such as IPMN.  last imaging stable in 12/2021. Plans for pancreatic protocol CT in 12/2023. Significantly claustrophobic and prefers CT.   Right lower lobe pulmonary nodule: Incidentally found on CT in December.  Surveillance CT completed January 2023, stable 9 x 7 mm right lower lobe pulmonary nodule felt to be benign but recommending follow-up chest CT in 12 months. We will make arrangements 07/2022.   The patient was found to have elevated blood pressure when vital signs were checked in the office. The blood pressure  was rechecked by the nursing staff and it was found be persistently elevated >140/90 mmHg. I personally advised to the patient to follow up closely with his PCP for hypertension control.  PLAN   RUQ U/S.  Labs to be updated for MELD, CBC, iron/tibc/ferritin, hepatoma screening.    Leanna Battles. Melvyn Neth, MHS, PA-C [facility]

## 2022-06-03 NOTE — Telephone Encounter (Signed)
Pt informed that [place] is scheduled for 06/06/22 at 10:30 am, arrive at 10:15 am, nothing to eat or drink after midnight. Pt verbalized understanding.

## 2022-06-06 ENCOUNTER — Ambulatory Visit (HOSPITAL_COMMUNITY)
Admission: RE | Admit: 2022-06-06 | Discharge: 2022-06-06 | Disposition: A | Discharge status: Home / Self Care | Payer: Medicare Other | Source: Ambulatory Visit | Attending: Gastroenterology | Admitting: Gastroenterology

## 2022-06-06 DIAGNOSIS — K7460 Unspecified cirrhosis of liver: Secondary | ICD-10-CM | POA: Insufficient documentation

## 2022-06-06 DIAGNOSIS — R7989 Other specified abnormal findings of blood chemistry: Secondary | ICD-10-CM | POA: Diagnosis not present

## 2022-06-06 DIAGNOSIS — K862 Cyst of pancreas: Secondary | ICD-10-CM | POA: Insufficient documentation

## 2022-06-06 DIAGNOSIS — K8020 Calculus of gallbladder without cholecystitis without obstruction: Secondary | ICD-10-CM | POA: Diagnosis not present

## 2022-06-07 LAB — COMPREHENSIVE METABOLIC PANEL
ALT: 38 IU/L — ABNORMAL HIGH (ref 0–32)
AST: 33 IU/L (ref 0–40)
Albumin/Globulin Ratio: 1.4 (ref 1.2–2.2)
Albumin: 4.2 g/dL (ref 3.8–4.8)
Alkaline Phosphatase: 99 IU/L (ref 44–121)
BUN/Creatinine Ratio: 23 (ref 12–28)
BUN: 21 mg/dL (ref 8–27)
Bilirubin Total: 0.7 mg/dL (ref 0.0–1.2)
CO2: 21 mmol/L (ref 20–29)
Calcium: 9.6 mg/dL (ref 8.7–10.3)
Chloride: 106 mmol/L (ref 96–106)
Creatinine, Ser: 0.93 mg/dL (ref 0.57–1.00)
Globulin, Total: 3 g/dL (ref 1.5–4.5)
Glucose: 117 mg/dL — ABNORMAL HIGH (ref 70–99)
Potassium: 4.2 mmol/L (ref 3.5–5.2)
Sodium: 142 mmol/L (ref 134–144)
Total Protein: 7.2 g/dL (ref 6.0–8.5)
eGFR: 64 mL/min/{1.73_m2} (ref >59)

## 2022-06-07 LAB — CBC WITH DIFFERENTIAL/PLATELET
Basophils Absolute: 0.1 10*3/uL (ref 0.0–0.2)
Basos: 1 %
EOS (ABSOLUTE): 0.2 10*3/uL (ref 0.0–0.4)
Eos: 3 %
Hematocrit: 36.1 % (ref 34.0–46.6)
Hemoglobin: 12.2 g/dL (ref 11.1–15.9)
Immature Grans (Abs): 0 10*3/uL (ref 0.0–0.1)
Immature Granulocytes: 0 %
Lymphocytes Absolute: 1.6 10*3/uL (ref 0.7–3.1)
Lymphs: 27 %
MCH: 32.1 pg (ref 26.6–33.0)
MCHC: 33.8 g/dL (ref 31.5–35.7)
MCV: 95 fL (ref 79–97)
Monocytes Absolute: 0.6 10*3/uL (ref 0.1–0.9)
Monocytes: 11 %
Neutrophils Absolute: 3.4 10*3/uL (ref 1.4–7.0)
Neutrophils: 58 %
Platelets: 133 10*3/uL — ABNORMAL LOW (ref 150–450)
RBC: 3.8 x10E6/uL (ref 3.77–5.28)
RDW: 12.2 % (ref 11.7–15.4)
WBC: 5.9 10*3/uL (ref 3.4–10.8)

## 2022-06-07 LAB — IRON,TIBC AND FERRITIN PANEL
Ferritin: 381 ng/mL — ABNORMAL HIGH (ref 15–150)
Iron Saturation: 39 % (ref 15–55)
Iron: 126 ug/dL (ref 27–139)
Total Iron Binding Capacity: 324 ug/dL (ref 250–450)
UIBC: 198 ug/dL (ref 118–369)

## 2022-06-07 LAB — PROTIME-INR
INR: 1.1 (ref 0.9–1.2)
Prothrombin Time: 11.7 s (ref 9.1–12.0)

## 2022-06-07 LAB — AFP TUMOR MARKER: AFP, Serum, Tumor Marker: 5 ng/mL (ref 0.0–9.2)

## 2022-06-11 ENCOUNTER — Telehealth: Payer: Self-pay | Admitting: Internal Medicine

## 2022-06-11 NOTE — Telephone Encounter (Signed)
January recall for CT chest

## 2022-06-11 NOTE — Telephone Encounter (Signed)
Recall sent 

## 2022-07-03 ENCOUNTER — Other Ambulatory Visit (HOSPITAL_COMMUNITY): Payer: Self-pay | Admitting: Family Medicine

## 2022-07-03 DIAGNOSIS — R531 Weakness: Secondary | ICD-10-CM

## 2022-07-03 DIAGNOSIS — R2689 Other abnormalities of gait and mobility: Secondary | ICD-10-CM

## 2022-07-03 DIAGNOSIS — E1129 Type 2 diabetes mellitus with other diabetic kidney complication: Secondary | ICD-10-CM | POA: Diagnosis not present

## 2022-07-03 DIAGNOSIS — R5383 Other fatigue: Secondary | ICD-10-CM | POA: Diagnosis not present

## 2022-07-03 DIAGNOSIS — E782 Mixed hyperlipidemia: Secondary | ICD-10-CM | POA: Diagnosis not present

## 2022-07-03 DIAGNOSIS — E063 Autoimmune thyroiditis: Secondary | ICD-10-CM | POA: Diagnosis not present

## 2022-07-03 DIAGNOSIS — E119 Type 2 diabetes mellitus without complications: Secondary | ICD-10-CM | POA: Diagnosis not present

## 2022-07-03 DIAGNOSIS — M2550 Pain in unspecified joint: Secondary | ICD-10-CM | POA: Diagnosis not present

## 2022-07-08 ENCOUNTER — Other Ambulatory Visit (HOSPITAL_COMMUNITY): Payer: Self-pay | Admitting: Family Medicine

## 2022-07-08 DIAGNOSIS — Z1231 Encounter for screening mammogram for malignant neoplasm of breast: Secondary | ICD-10-CM

## 2022-07-14 ENCOUNTER — Ambulatory Visit (HOSPITAL_COMMUNITY)
Admission: RE | Admit: 2022-07-14 | Discharge: 2022-07-14 | Disposition: A | Discharge status: Home / Self Care | Payer: Medicare Other | Source: Ambulatory Visit | Attending: Family Medicine | Admitting: Family Medicine

## 2022-07-14 DIAGNOSIS — Z1231 Encounter for screening mammogram for malignant neoplasm of breast: Secondary | ICD-10-CM

## 2022-07-24 ENCOUNTER — Other Ambulatory Visit: Payer: Self-pay | Admitting: Family Medicine

## 2022-07-24 DIAGNOSIS — R2689 Other abnormalities of gait and mobility: Secondary | ICD-10-CM

## 2022-07-24 DIAGNOSIS — R531 Weakness: Secondary | ICD-10-CM

## 2022-07-30 ENCOUNTER — Ambulatory Visit (HOSPITAL_COMMUNITY): Payer: Medicare Other

## 2022-07-30 ENCOUNTER — Encounter (HOSPITAL_COMMUNITY): Payer: Self-pay

## 2022-08-17 ENCOUNTER — Ambulatory Visit
Admission: RE | Admit: 2022-08-17 | Discharge: 2022-08-17 | Disposition: A | Discharge status: Home / Self Care | Payer: Medicare Other | Source: Ambulatory Visit | Attending: Family Medicine | Admitting: Family Medicine

## 2022-08-17 DIAGNOSIS — R531 Weakness: Secondary | ICD-10-CM

## 2022-08-17 DIAGNOSIS — I639 Cerebral infarction, unspecified: Secondary | ICD-10-CM | POA: Diagnosis not present

## 2022-08-17 DIAGNOSIS — R2689 Other abnormalities of gait and mobility: Secondary | ICD-10-CM

## 2022-08-28 DIAGNOSIS — R7989 Other specified abnormal findings of blood chemistry: Secondary | ICD-10-CM | POA: Diagnosis not present

## 2022-08-28 DIAGNOSIS — M79642 Pain in left hand: Secondary | ICD-10-CM | POA: Diagnosis not present

## 2022-08-28 DIAGNOSIS — M79641 Pain in right hand: Secondary | ICD-10-CM | POA: Diagnosis not present

## 2022-08-28 DIAGNOSIS — M2540 Effusion, unspecified joint: Secondary | ICD-10-CM | POA: Diagnosis not present

## 2022-08-28 DIAGNOSIS — M79672 Pain in left foot: Secondary | ICD-10-CM | POA: Diagnosis not present

## 2022-08-28 DIAGNOSIS — M2550 Pain in unspecified joint: Secondary | ICD-10-CM | POA: Diagnosis not present

## 2022-08-28 DIAGNOSIS — Z79899 Other long term (current) drug therapy: Secondary | ICD-10-CM | POA: Diagnosis not present

## 2022-08-28 DIAGNOSIS — M2560 Stiffness of unspecified joint, not elsewhere classified: Secondary | ICD-10-CM | POA: Diagnosis not present

## 2022-08-28 DIAGNOSIS — R5383 Other fatigue: Secondary | ICD-10-CM | POA: Diagnosis not present

## 2022-08-28 DIAGNOSIS — E569 Vitamin deficiency, unspecified: Secondary | ICD-10-CM | POA: Diagnosis not present

## 2022-08-28 DIAGNOSIS — E559 Vitamin D deficiency, unspecified: Secondary | ICD-10-CM | POA: Diagnosis not present

## 2022-08-28 DIAGNOSIS — M79671 Pain in right foot: Secondary | ICD-10-CM | POA: Diagnosis not present

## 2022-08-28 DIAGNOSIS — R531 Weakness: Secondary | ICD-10-CM | POA: Diagnosis not present

## 2022-09-05 ENCOUNTER — Encounter: Payer: Self-pay | Admitting: Neurology

## 2022-09-16 DIAGNOSIS — R7989 Other specified abnormal findings of blood chemistry: Secondary | ICD-10-CM | POA: Diagnosis not present

## 2022-09-16 DIAGNOSIS — M79641 Pain in right hand: Secondary | ICD-10-CM | POA: Diagnosis not present

## 2022-09-16 DIAGNOSIS — M79672 Pain in left foot: Secondary | ICD-10-CM | POA: Diagnosis not present

## 2022-09-16 DIAGNOSIS — M79642 Pain in left hand: Secondary | ICD-10-CM | POA: Diagnosis not present

## 2022-09-16 DIAGNOSIS — M2560 Stiffness of unspecified joint, not elsewhere classified: Secondary | ICD-10-CM | POA: Diagnosis not present

## 2022-09-16 DIAGNOSIS — M79671 Pain in right foot: Secondary | ICD-10-CM | POA: Diagnosis not present

## 2022-09-16 DIAGNOSIS — R5383 Other fatigue: Secondary | ICD-10-CM | POA: Diagnosis not present

## 2022-09-16 DIAGNOSIS — M2550 Pain in unspecified joint: Secondary | ICD-10-CM | POA: Diagnosis not present

## 2022-09-16 DIAGNOSIS — M2540 Effusion, unspecified joint: Secondary | ICD-10-CM | POA: Diagnosis not present

## 2022-09-22 NOTE — Progress Notes (Signed)
Initial neurology clinic note  SERVICE DATE: 09/26/22  Reason for Evaluation: Consultation requested by [name], PA* for an opinion regarding left hand weakness and imbalance. My final recommendations will be communicated back to the requesting physician by way of shared medical record or letter to requesting physician via [place] mail.  HPI: This is Ms. Jaime Matthews, a 77 y.o. right-handed female with a medical history of DM, HTN, HLD, hypothyroidism who presents to neurology clinic with the chief complaint of left hand weakness and imbalance. The patient is alone today.  Patient thinks her symptoms happened about 1 year ago. She feels like it happened suddenly. She has poor dexterity in her left hand. She is not able to quilt and do other activities she once was. She denies any change in sensation in the hand. She denies symptoms in her right arm or either leg. She has occasional neck pain, it can radiate into triceps, but not into hand.  She started having problems with balance for the last few months. She has not fallen, but has to be careful as she feels like she could. She denies numbness or tingling in feet or weakness in legs. She mentions she had migratory joint swelling. She was seen by rheumatology who is treating her for RA (vs PMR). She was on a short course of steroids and then put on plaquenil. This improved the balance some, but there has been no change to the left hand.  She denies occulobulbar symptoms. She denies pseudobulbar affect.  MRI brain ordered by PCP, which was completed on 08/17/22. It did not show a lesion that would explain symptoms. There was a hyperintensity in the left frontal area. Patient mentions a history of migraines and wonders if it is do to that.  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 She does not remember what symptoms she had but does not think current symptoms are similar.  She does not report any constitutional symptoms like  fever, night sweats, anorexia or unintentional weight loss.  EtOH use: None  Restrictive diet? No Family history of neuropathy/myopathy/neurologic disease? Brother also had polio  She has never had EMG.   MEDICATIONS:  Outpatient Encounter Medications as of 09/26/2022  Medication Sig   aspirin EC 81 MG tablet Take 81 mg by mouth daily. Swallow whole.   atorvastatin (LIPITOR) 10 MG tablet Take 1 tablet by mouth daily.   docusate sodium (COLACE) 100 MG capsule Take 200 mg by mouth daily.   Dulaglutide (TRULICITY) 1.5 0000000 SOPN Inject into the skin every 7 (seven) days.   hydroxychloroquine (PLAQUENIL) 200 MG tablet Take 200 mg by mouth daily. 1.5 day   levothyroxine (SYNTHROID) 50 MCG tablet Take 1 tablet by mouth daily.   lisinopril (ZESTRIL) 2.5 MG tablet Take 1 tablet by mouth daily.   Multiple Vitamins-Minerals (CENTRUM SILVER 50+WOMEN) TABS Take 1 tablet by mouth daily.   Omega-3 Fatty Acids (FISH OIL) 1200 MG CAPS Take 2 capsules by mouth daily.   ONETOUCH ULTRA test strip USE TO TEST BLOODSUGAR TWICE DAILY   No facility-administered encounter medications on file as of 09/26/2022.    PAST MEDICAL HISTORY: Past Medical History:  Diagnosis Date   Cirrhosis ([place])    Diabetes ([place])    diagosed around 2010.    Hyperlipidemia    Hypothyroidism     PAST SURGICAL HISTORY: Past Surgical History:  Procedure Laterality Date   ABDOMINAL HYSTERECTOMY     APPENDECTOMY     BUNIONECTOMY  TONSILLECTOMY      ALLERGIES: No Known Allergies  FAMILY HISTORY: Family History  Problem Relation Age of Onset   Thyroid disease Mother    Prostate cancer Father    Melanoma Father        died in his 47s   Diabetes Brother    Colon cancer Neg Hx    Liver disease Neg Hx     SOCIAL HISTORY: Social History   Tobacco Use   Smoking status: Never   Smokeless tobacco: Never  Vaping Use   Vaping Use: Never used  Substance Use Topics   Alcohol use: Never   Drug use: Never    Social History   Social History Narrative   Are you right handed or left handed? Right   Are you currently employed ?    What is your current occupation?retired   Do you live at home alone?   Who lives with you? husband   What type of home do you live in: 1 story or 2 story? one    Caffeine 2 cup a day     OBJECTIVE: PHYSICAL EXAM: BP 137/76   Pulse 84   Ht 5' 5.5" (1.664 m)   Wt 168 lb 12.8 oz (76.6 kg)   SpO2 96%   BMI 27.66 kg/m   General: General appearance: Awake and alert. No distress. Cooperative with exam.  Skin: No obvious rash or jaundice. HEENT: Atraumatic. Anicteric. Lungs: Non-labored breathing on room air  Extremities: No edema. Psych: Affect appropriate.  Neurological: Mental Status: Alert. Speech fluent. No pseudobulbar affect Cranial Nerves: CNII: No RAPD. Visual fields grossly intact. CNIII, IV, VI: PERRL. No nystagmus. EOMI. CN V: Facial sensation intact bilaterally to fine touch. [place] clench strong. Jaw jerk negative. CN VII: Facial muscles symmetric and strong. No ptosis at rest CN VIII: Hearing grossly intact bilaterally. CN IX: No hypophonia. CN X: Palate elevates symmetrically. CN XI: Full strength shoulder shrug bilaterally. CN XII: Tongue protrusion full and midline. No atrophy or fasciculations. No significant dysarthria Motor: Tone is normal. No obvious fasciculations in any extremities. (Patient examined in gown) Atrophy of left intrinsic hand muscles.  Individual muscle group testing (MRC grade out of 5):  Movement     Neck flexion 5    Neck extension 5     Right Left   Shoulder abduction 5 5   Elbow flexion 5 5   Elbow extension 5 5   Wrist extension 5 5   Wrist flexion 5 5   Finger abduction - FDI 5 3   Finger abduction - ADM 5 3   Finger extension 5 5   Finger distal flexion - 2/3 5 5-   Finger distal flexion - 4/5 5 4+   Thumb flexion - FPL 5 5   Thumb abduction - APB 5 4    Hip flexion 5 5   Hip extension  5 5   Hip adduction 5 5   Hip abduction 5 5   Knee extension 5 5   Knee flexion 5 5   Dorsiflexion 5 5   Plantarflexion 5 5     Reflexes:  Right Left   Bicep 1+ 1+   Tricep 1+ 1+   BrRad 1+ 1+   Knee 2+ 2+   Ankle 0 0    Pathological Reflexes: Babinski: flexor response bilaterally Hoffman: absent bilaterally Troemner: absent bilaterally Facial: Absent bilaterally Midline tap: Absent Sensation: Pinprick: Diminished in palm of left hand compared to right hand in upper  extremities. Intact in bilateral lower extremities. Vibration: Intact in upper extremities. Absent in bilateral great toes. Proprioception: Intact in bilateral great toes Coordination: Intact finger-to- nose-finger bilaterally. Romberg with moderate sway. Gait: Able to rise from chair with arms crossed unassisted. Normal, narrow-based gait. Able to walk on toes and heels.  Lab and Test Review: Internal labs: 06/06/22: CBC significant for thrombocytopenia (pts 133) CMP significant for elevated glucose (117)  ANA (01/10/22): negative Transglutaminase IgA (01/10/22): negative  External labs: Can't read external labs on referring information  Imaging: MRI brain wo contrast (08/17/22): Per my read: Mild atrophy, including of cerebellum  Radiology read: FINDINGS: Brain: Mild generalized age related volume loss without lobar predominance. No focal abnormality affects the brainstem or cerebellum. Cerebral hemispheres show a single punctate subcortical white matter stroke in the left posterior frontal lobe, unlikely to be significant and representative of less vascular disease than is usually seen in persons of this age. No cortical or large vessel territory stroke. No mass lesion, hemorrhage, hydrocephalus or extra-axial collection.   Vascular: Major vessels at the base of the brain show flow.   Skull and upper cervical spine: Negative   Sinuses/Orbits: Clear/normal   Other: None   IMPRESSION: No cause  of the presenting symptoms is identified. Mild generalized age related volume loss without lobar predominance. Single punctate subcortical white matter stroke in the left posterior frontal lobe, unlikely to be of any clinical significance and representative of less vascular disease than is usually seen in persons of this age.  ASSESSMENT: Shalea Monares is a 77 y.o. female who presents for evaluation of weakness of left hand and imbalance. She has a relevant medical history of DM, HTN, HLD, hypothyroidism. Her neurological examination is pertinent for atrophy and weakness of intrinsic hands muscles on the left with diminished sensation on left palm and bilateral feet. 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 I will look for other treatable causes. The left hand weakness could be due to carpal tunnel (median neuropathy at the wrist) and ulnar neuropathy though it would be odd to preferentially affect her non-dominant hand. Cervical radiculopathy is also possible (C8?). Motor neuron disease could be considered, but there are also sensory changes, which would not be expected. I will get EMG to further clarify symptoms in feet and left hand.  PLAN: -Blood work: B12, HbA1c, IFE -EMG: LUE and LLE   -Return to clinic to be determined when testing is complete  The impression above as well as the plan as outlined below were extensively discussed with the patient who voiced understanding. All questions were answered to their satisfaction.  The patient was counseled on pertinent fall precautions per the printed material provided today, and as noted under the "Patient Instructions" section below.  When available, results of the above investigations and possible further recommendations will be communicated to the patient via telephone/MyChart. Patient to call office if not contacted after expected  testing turnaround time.   Total time spent reviewing records, interview, history/exam, documentation, and coordination of care on day of encounter:  55 min   Thank you for allowing me to participate in patient's care.  If I can answer any additional questions, I would be pleased to do so.  Kai Levins, MD   CC: [name], PA-C [place] [phone]  CC: Referring provider: [name], PA-C [address] [place],  [place] [address]

## 2022-09-23 DIAGNOSIS — R5383 Other fatigue: Secondary | ICD-10-CM | POA: Diagnosis not present

## 2022-09-23 DIAGNOSIS — M79641 Pain in right hand: Secondary | ICD-10-CM | POA: Diagnosis not present

## 2022-09-23 DIAGNOSIS — M79672 Pain in left foot: Secondary | ICD-10-CM | POA: Diagnosis not present

## 2022-09-23 DIAGNOSIS — M79642 Pain in left hand: Secondary | ICD-10-CM | POA: Diagnosis not present

## 2022-09-23 DIAGNOSIS — M79671 Pain in right foot: Secondary | ICD-10-CM | POA: Diagnosis not present

## 2022-09-23 DIAGNOSIS — M0609 Rheumatoid arthritis without rheumatoid factor, multiple sites: Secondary | ICD-10-CM | POA: Diagnosis not present

## 2022-09-23 DIAGNOSIS — M2540 Effusion, unspecified joint: Secondary | ICD-10-CM | POA: Diagnosis not present

## 2022-09-23 DIAGNOSIS — M2560 Stiffness of unspecified joint, not elsewhere classified: Secondary | ICD-10-CM | POA: Diagnosis not present

## 2022-09-26 ENCOUNTER — Other Ambulatory Visit (INDEPENDENT_AMBULATORY_CARE_PROVIDER_SITE_OTHER): Payer: Medicare Other

## 2022-09-26 ENCOUNTER — Encounter: Payer: Self-pay | Admitting: Neurology

## 2022-09-26 ENCOUNTER — Ambulatory Visit: Payer: Medicare Other | Admitting: Neurology

## 2022-09-26 VITALS — BP 137/76 | HR 84 | Ht 65.5 in | Wt 168.8 lb

## 2022-09-26 DIAGNOSIS — R200 Anesthesia of skin: Secondary | ICD-10-CM | POA: Diagnosis not present

## 2022-09-26 DIAGNOSIS — R2689 Other abnormalities of gait and mobility: Secondary | ICD-10-CM | POA: Diagnosis not present

## 2022-09-26 DIAGNOSIS — E119 Type 2 diabetes mellitus without complications: Secondary | ICD-10-CM

## 2022-09-26 DIAGNOSIS — R29898 Other symptoms and signs involving the musculoskeletal system: Secondary | ICD-10-CM | POA: Diagnosis not present

## 2022-09-26 NOTE — Patient Instructions (Addendum)
I saw you today for weakness of left hand and imbalance. The imbalance may be related to nerve damage called neuropathy. The most common cause of this is diabetes, but I would like to get labs to look for other possible causes we can treat.  The hand weakness appears to be due to nerve problems to muscles in the hands.  I would like to do a muscle and nerve test called an EMG to clarify further (see more information below).  We will determine next steps after we have these results.  The physicians and staff at [facility] are committed to providing excellent care. You may receive a survey requesting feedback about your experience at our office. We strive to receive "very good" responses to the survey questions. If you feel that your experience would prevent you from giving the office a "very good " response, please contact our office to try to remedy the situation. We may be reached at [phone]. Thank you for taking the time out of your busy day to complete the survey.  Kai Levins, MD [facility]  ELECTROMYOGRAM AND NERVE CONDUCTION STUDIES (EMG/NCS) INSTRUCTIONS  How to Prepare The neurologist conducting the EMG will need to know if you have certain medical conditions. Tell the neurologist and other EMG lab personnel if you: Have a pacemaker or any other electrical medical device Take blood-thinning medications Have hemophilia, a blood-clotting disorder that causes prolonged bleeding Bathing Take a shower or bath shortly before your exam in order to remove oils from your skin. Don't apply lotions or creams before the exam.  What to Expect You'll likely be asked to change into a hospital gown for the procedure and lie down on an examination table. The following explanations can help you understand what will happen during the exam.  Electrodes. The neurologist or a technician places surface electrodes at various locations on your skin depending on where you're experiencing  symptoms. Or the neurologist may insert needle electrodes at different sites depending on your symptoms.  Sensations. The electrodes will at times transmit a tiny electrical current that you may feel as a twinge or spasm. The needle electrode may cause discomfort or pain that usually ends shortly after the needle is removed. If you are concerned about discomfort or pain, you may want to talk to the neurologist about taking a short break during the exam.  Instructions. During the needle EMG, the neurologist will assess whether there is any spontaneous electrical activity when the muscle is at rest - activity that isn't present in healthy muscle tissue - and the degree of activity when you slightly contract the muscle.  He or she will give you instructions on resting and contracting a muscle at appropriate times. Depending on what muscles and nerves the neurologist is examining, he or she may ask you to change positions during the exam.  After your EMG You may experience some temporary, minor bruising where the needle electrode was inserted into your muscle. This bruising should fade within several days. If it persists, contact your primary care doctor.   Preventing Falls at [facility] are common, often dreaded events in the lives of older people. Aside from the obvious injuries and even death that may result, fall can cause wide-ranging consequences including loss of independence, mental decline, decreased activity and mobility. Younger people are also at risk of falling, especially those with chronic illnesses and fatigue.  Ways to reduce risk for falling Examine diet and medications. Warm foods and alcohol dilate  blood vessels, which can lead to dizziness when standing. Sleep aids, antidepressants and pain medications can also increase the likelihood of a fall.  Get a vision exam. Poor vision, cataracts and glaucoma increase the chances of falling.  Check foot gear. Shoes should fit snugly and  have a sturdy, nonskid sole and a broad, low heel  Participate in a physician-approved exercise program to build and maintain muscle strength and improve balance and coordination. Programs that use ankle weights or stretch bands are excellent for muscle-strengthening. Water aerobics programs and low-impact Tai Chi programs have also been shown to improve balance and coordination.  Increase vitamin D intake. Vitamin D improves muscle strength and increases the amount of calcium the body is able to absorb and deposit in bones.  How to prevent falls from common hazards Floors - Remove all loose wires, cords, and throw rugs. Minimize clutter. Make sure rugs are anchored and smooth. Keep furniture in its usual place.  Chairs -- Use chairs with straight backs, armrests and firm seats. Add firm cushions to existing pieces to add height.  Bathroom - Install grab bars and non-skid tape in the tub or shower. Use a bathtub transfer bench or a shower chair with a back support Use an elevated toilet seat and/or safety rails to assist standing from a low surface. Do not use towel racks or bathroom tissue holders to help you stand.  Lighting - Make sure halls, stairways, and entrances are well-lit. Install a night light in your bathroom or hallway. Make sure there is a light switch at the top and bottom of the staircase. Turn lights on if you get up in the middle of the night. Make sure lamps or light switches are within reach of the bed if you have to get up during the night.  Kitchen - Install non-skid rubber mats near the sink and stove. Clean spills immediately. Store frequently used utensils, pots, pans between waist and eye level. This helps prevent reaching and bending. Sit when getting things out of lower cupboards.  Living room/ Bedrooms - Place furniture with wide spaces in between, giving enough room to move around. Establish a route through the living room that gives you something to hold onto as you  walk.  Stairs - Make sure treads, rails, and rugs are secure. Install a rail on both sides of the stairs. If stairs are a threat, it might be helpful to arrange most of your activities on the lower level to reduce the number of times you must climb the stairs.  Entrances and doorways - Install metal handles on the walls adjacent to the doorknobs of all doors to make it more secure as you travel through the doorway.  Tips for maintaining balance Keep at least one hand free at all times. Try using a backpack or fanny pack to hold things rather than carrying them in your hands. Never carry objects in both hands when walking as this interferes with keeping your balance.  Attempt to swing both arms from front to back while walking. This might require a conscious effort if Parkinson's disease has diminished your movement. It will, however, help you to maintain balance and posture, and reduce fatigue.  Consciously lift your feet off of the ground when walking. Shuffling and dragging of the feet is a common culprit in losing your balance.  When trying to navigate turns, use a "U" technique of facing forward and making a wide turn, rather than pivoting sharply.  Try to stand with  your feet shoulder-length apart. When your feet are close together for any length of time, you increase your risk of losing your balance and falling.  Do one thing at a time. Don't try to walk and accomplish another task, such as reading or looking around. The decrease in your automatic reflexes complicates motor function, so the less distraction, the better.  Do not wear rubber or gripping soled shoes, they might "catch" on the floor and cause tripping.  Move slowly when changing positions. Use deliberate, concentrated movements and, if needed, use a grab bar or walking aid. Count 15 seconds between each movement. For example, when rising from a seated position, wait 15 seconds after standing to begin walking.  If balance is  a continuous problem, you might want to consider a walking aid such as a cane, walking stick, or walker. Once you've mastered walking with help, you might be ready to try it on your own again.

## 2022-10-01 LAB — IMMUNOFIXATION ELECTROPHORESIS
IgG (Immunoglobin G), Serum: 1270 mg/dL (ref 600–1540)
IgM, Serum: 55 mg/dL (ref 50–300)
Immunoglobulin A: 452 mg/dL — ABNORMAL HIGH (ref 70–320)

## 2022-10-01 LAB — HEMOGLOBIN A1C
Hgb A1c MFr Bld: 7.1 % of total Hgb — ABNORMAL HIGH (ref <5.7)
Mean Plasma Glucose: 157 mg/dL
eAG (mmol/L): 8.7 mmol/L

## 2022-10-01 LAB — VITAMIN B12: Vitamin B-12: 1184 pg/mL — ABNORMAL HIGH (ref 200–1100)

## 2022-10-28 ENCOUNTER — Ambulatory Visit: Payer: Medicare Other | Admitting: Neurology

## 2022-10-28 ENCOUNTER — Telehealth: Payer: Self-pay | Admitting: Neurology

## 2022-10-28 ENCOUNTER — Other Ambulatory Visit: Payer: Self-pay

## 2022-10-28 DIAGNOSIS — R29898 Other symptoms and signs involving the musculoskeletal system: Secondary | ICD-10-CM | POA: Diagnosis not present

## 2022-10-28 DIAGNOSIS — R2689 Other abnormalities of gait and mobility: Secondary | ICD-10-CM

## 2022-10-28 DIAGNOSIS — R200 Anesthesia of skin: Secondary | ICD-10-CM

## 2022-10-28 DIAGNOSIS — E119 Type 2 diabetes mellitus without complications: Secondary | ICD-10-CM

## 2022-10-28 DIAGNOSIS — M5412 Radiculopathy, cervical region: Secondary | ICD-10-CM

## 2022-10-28 NOTE — Procedures (Addendum)
[facility]  [address] [place], [address]  [place], [place] [address] Tel: [phone] Fax: [phone] Test Date:  10/28/2022  Patient: Jaime Matthews DOB: 02/05/1946 Physician: Jacquelyne Balint, MD  Sex: Female Height: 5' 5.5" Ref Phys: Jacquelyne Balint, MD  ID#: [id number]   Technician:    History: This is a 77 year old female with imbalance and left hand weakness.  NCV & EMG Findings: Extensive electrodiagnostic evaluation of the left upper and lower limbs with additional nerve conduction studies of the right upper and lower limbs shows: Left sural, left superficial peroneal/fibular, bilateral median, bilateral ulnar, and left radial sensory responses are within normal limits. Left peroneal/fibular (EDB) motor response shows mildly reduced amplitude (2.1 mV). Left median (APB) motor response shows reduced amplitude (2.3 mV). Left ulnar (ADM and FDI) shows reduced amplitude (ADM 5.3 mV, FDI 3.5 mV). Left tibial (AH), left peroneal/fibular (TA), right peroneal/fibular (EDB), right median (APB), and right ulnar (ADM) are within normal limits. Left H reflex is absent. Chronic motor axon loss changes without accompanying active denervation changes are seen in the left first dorsal interosseous, extensor indicis proprius, flexor pollicis longus, abductor pollicis brevis, and cervical paraspinal (C7 level) muscles. There are no active denervation changes or fasciculations in any tested muscles.  Impression: This is an abnormal study. 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 No electrodiagnostic evidence of a left lumbosacral (L3-S1) motor radiculopathy. No electrodiagnostic evidence of a large fiber sensorimotor neuropathy. No electrodiagnostic evidence at this time of a disorder of anterior horn cells (ie: motor neuron  disease). No electrodiagnostic evidence of a left median or ulnar mononeuropathy. Low amplitude left peroneal/fibular (EDB) motor response is likely technical given no corresponding changes seen on needle examination.    ___________________________ Jacquelyne Balint, MD    Nerve Conduction Studies Motor Nerve Results    Latency Amplitude F-Lat Segment Distance CV Comment  Site (ms) Norm (mV) Norm (ms)  (cm) (m/s) Norm   Left Fibular (EDB) Motor  Ankle 3.6  < 6.0 *2.1  > 2.5        Bel fib head 11.9 - 1.54 -  Bel fib head-Ankle 35 42  > 40   Pop fossa 13.8 - 1.48 -  Pop fossa-Bel fib head 8 42 -   Right Fibular (EDB) Motor  Ankle 4.6  < 6.0 2.9  > 2.5        Left Fibular (TA) Motor  Fib head 2.5  < 4.5 5.3  > 3.0        Pop fossa 4.3  < 6.7 4.7 -  Pop fossa-Fib head 8 44  > 40   Left Median (APB) Motor  Wrist 3.8  < 4.0 *2.3  > 5.0        Elbow 10.0 - 2.1 -  Elbow-Wrist 28 *45  > 50   Right Median (APB) Motor  Wrist 3.5  < 4.0 7.5  > 5.0        Left Tibial (AH) Motor  Ankle 4.6  < 6.0 7.2  > 4.0        Knee 14.3 - 7.0 -  Knee-Ankle 38.5 40  > 40   Left Ulnar (ADM) Motor  Wrist 2.4  < 3.1 *5.3  > 7.0        Bel elbow 6.6 - 4.6 -  Bel  elbow-Wrist 21.5 51  > 50   Ab elbow 8.6 - 4.4 -  Ab elbow-Bel elbow 10 50 -   Right Ulnar (ADM) Motor  Wrist 2.1  < 3.1 9.3  > 7.0        Bel elbow 6.3 - 8.4 -  Bel elbow-Wrist 22.5 54  > 50   Ab elbow 8.3 - 8.0 -  Ab elbow-Bel elbow 10 50 -   Left Ulnar (FDI) Motor  Wrist 2.5  < 4.5 *3.5  > 7.0         Sensory Sites    Neg Peak Lat Amplitude (O-P) Segment Distance Velocity Comment  Site (ms) Norm (V) Norm  (cm) (ms)   Left Dorsal Ulnar Cutaneous Sensory  Wrist-Dorsum 5th MC 1.80  < 3.2 11  > 5 Wrist-Dorsum 5th MC 8    Left Median Sensory  Wrist-Dig II 3.5  < 3.8 19  > 10 Wrist-Dig II 13    Right Median Sensory  Wrist-Dig II 3.7  < 3.8 11  > 10 Wrist-Dig II 13    Left Radial Sensory  Forearm-Wrist 2.4  < 2.8 18  > 10 Forearm-Wrist 10     Left Superficial Fibular Sensory  14 cm-Ankle 3.6  < 4.6 4  > 3 14 cm-Ankle 14    Left Sural Sensory  Calf-Lat mall 3.5  < 4.6 5  > 3 Calf-Lat mall 14    Left Ulnar Sensory  Wrist-Dig V 2.7  < 3.2 16  > 5 Wrist-Dig V 11    Right Ulnar Sensory  Wrist-Dig V 2.7  < 3.2 21  > 5 Wrist-Dig V 11     H-Reflex Results    M-Lat H Lat H Neg Amp H-M Lat  Site (ms) (ms) Norm (mV) (ms)  Left Tibial H-Reflex  Pop fossa 6.8 -  < 35.0 - -   Electromyography   Side Muscle Ins.Act Fibs Fasc Recrt Amp Dur Poly Activation Comment  Left Tib ant Nml Nml Nml Nml Nml Nml Nml Nml N/A  Left Gastroc MH Nml Nml Nml Nml Nml Nml Nml Nml N/A  Left FDL Nml Nml Nml Nml Nml Nml Nml Nml N/A  Left EDB Nml Nml Nml N.E. --- --- --- N.E. N/A  Left AH Nml Nml Nml N.E. --- --- --- N.E. N/A  Left Rectus fem Nml Nml Nml Nml Nml Nml Nml Nml N/A  Left Biceps fem SH Nml Nml Nml Nml Nml Nml Nml Nml N/A  Left Gluteus med Nml Nml Nml Nml Nml Nml Nml Nml N/A  Left FDI Nml Nml Nml *SMU *1+ *1+ *1+ Nml N/A  Left EIP Nml Nml Nml *3- *2+ *2+ *2+ Nml N/A  Left APB Nml Nml Nml *SMU *2+ *2+ *2+ Nml N/A  Left FPL Nml Nml Nml *1- *1+ *1+ *1+ Nml N/A  Left Pronator teres Nml Nml Nml Nml Nml Nml Nml Nml N/A  Left Biceps Nml Nml Nml Nml Nml Nml Nml Nml N/A  Left Triceps lat hd Nml Nml Nml Nml Nml Nml Nml Nml N/A  Left Deltoid Nml Nml Nml Nml Nml Nml Nml Nml N/A  Left C7 PSP Nml Nml Nml *1- *1+ *1+ *1+ Nml N/A      Waveforms:  Motor                      Sensory  H-Reflex

## 2022-10-28 NOTE — Telephone Encounter (Signed)
Discussed the results of patient's EMG after the procedure today. It showed evidence of a left C8 and T1 radiculopathy, moderate to severe in degree electrically. There was no evidence of neuropathy.  We discussed that cervical radiculopathy and cervical spine stenosis could explain her symptoms of left hand weakness and gait imbalance. I recommended an MRI cervical spine, which the patient agreed to complete. Follow up will be determined after the results of MRI cervical spine.  All questions were answered.  Jacquelyne Balint, MD [facility]

## 2022-11-27 ENCOUNTER — Ambulatory Visit
Admission: RE | Admit: 2022-11-27 | Discharge: 2022-11-27 | Disposition: A | Discharge status: Home / Self Care | Payer: Medicare Other | Source: Ambulatory Visit | Attending: Neurology | Admitting: Neurology

## 2022-11-27 DIAGNOSIS — R2689 Other abnormalities of gait and mobility: Secondary | ICD-10-CM

## 2022-11-27 DIAGNOSIS — M5412 Radiculopathy, cervical region: Secondary | ICD-10-CM

## 2022-11-27 DIAGNOSIS — R200 Anesthesia of skin: Secondary | ICD-10-CM

## 2022-11-27 DIAGNOSIS — M4722 Other spondylosis with radiculopathy, cervical region: Secondary | ICD-10-CM | POA: Diagnosis not present

## 2022-12-04 ENCOUNTER — Encounter: Payer: Self-pay | Admitting: Gastroenterology

## 2022-12-04 DIAGNOSIS — I10 Essential (primary) hypertension: Secondary | ICD-10-CM | POA: Diagnosis not present

## 2022-12-04 DIAGNOSIS — E1129 Type 2 diabetes mellitus with other diabetic kidney complication: Secondary | ICD-10-CM | POA: Diagnosis not present

## 2022-12-04 DIAGNOSIS — Z0001 Encounter for general adult medical examination with abnormal findings: Secondary | ICD-10-CM | POA: Diagnosis not present

## 2022-12-04 DIAGNOSIS — E782 Mixed hyperlipidemia: Secondary | ICD-10-CM | POA: Diagnosis not present

## 2022-12-04 DIAGNOSIS — R531 Weakness: Secondary | ICD-10-CM | POA: Diagnosis not present

## 2022-12-04 DIAGNOSIS — M2550 Pain in unspecified joint: Secondary | ICD-10-CM | POA: Diagnosis not present

## 2022-12-04 DIAGNOSIS — S61401A Unspecified open wound of right hand, initial encounter: Secondary | ICD-10-CM | POA: Diagnosis not present

## 2022-12-08 ENCOUNTER — Encounter: Payer: Self-pay | Admitting: Neurology

## 2022-12-09 ENCOUNTER — Telehealth: Payer: Self-pay

## 2022-12-09 DIAGNOSIS — R2689 Other abnormalities of gait and mobility: Secondary | ICD-10-CM

## 2022-12-09 DIAGNOSIS — R29898 Other symptoms and signs involving the musculoskeletal system: Secondary | ICD-10-CM

## 2022-12-09 NOTE — Telephone Encounter (Signed)
Referrals added.

## 2022-12-09 NOTE — Telephone Encounter (Signed)
-----   Message from Antony Madura, MD sent at 12/09/2022 10:21 AM EDT ----- Regarding: PT and OT referrals Can we order PT (for imbalance) and OT (for hand weakness) for this patient? She lives in [place] and would prefer to go to [place] if possible. I have already spoken to the patient regarding the referrals.  Thank you,  Jacquelyne Balint, MD

## 2022-12-10 ENCOUNTER — Encounter (HOSPITAL_COMMUNITY): Payer: Self-pay | Admitting: Physical Therapy

## 2022-12-10 ENCOUNTER — Ambulatory Visit (HOSPITAL_COMMUNITY): Payer: Medicare Other | Attending: Neurology | Admitting: Physical Therapy

## 2022-12-10 ENCOUNTER — Other Ambulatory Visit: Payer: Self-pay

## 2022-12-10 DIAGNOSIS — R2689 Other abnormalities of gait and mobility: Secondary | ICD-10-CM | POA: Diagnosis not present

## 2022-12-10 DIAGNOSIS — R278 Other lack of coordination: Secondary | ICD-10-CM | POA: Insufficient documentation

## 2022-12-10 DIAGNOSIS — R29898 Other symptoms and signs involving the musculoskeletal system: Secondary | ICD-10-CM | POA: Insufficient documentation

## 2022-12-10 NOTE — Therapy (Signed)
OUTPATIENT PHYSICAL THERAPY LOWER EXTREMITY EVALUATION   Patient Name: Jaime Matthews MRN: [id number] DOB:Mar 18, 1946, 77 y.o., female Today's Date: 12/10/2022  END OF SESSION:  PT End of Session - 12/10/22 1110     Visit Number 1    Number of Visits 1    Date for PT Re-Evaluation 12/10/22    Authorization Type [employer] Medicare    PT Start Time 1116    PT Stop Time 1132    PT Time Calculation (min) 16 min    Activity Tolerance Patient tolerated treatment well    Behavior During Therapy WFL for tasks assessed/performed             Past Medical History:  Diagnosis Date   Cirrhosis (HCC)    Diabetes (HCC)    diagosed around 2010.    Hyperlipidemia    Hypothyroidism    Past Surgical History:  Procedure Laterality Date   ABDOMINAL HYSTERECTOMY     APPENDECTOMY     BUNIONECTOMY     TONSILLECTOMY     Patient Active Problem List   Diagnosis Date Noted   Solitary pulmonary nodule 11/19/2020   Hepatic cirrhosis (HCC) 05/14/2020   Abnormal liver ultrasound 12/19/2019   Abnormal LFTs 12/19/2019   Pancreatic cyst 12/19/2019    PCP: Terie Purser PA-C  REFERRING PROVIDER: Drema Dallas, DO  REFERRING DIAG: R26.89 (ICD-10-CM) - Imbalance  THERAPY DIAG:  Other abnormalities of gait and mobility  Imbalance  Rationale for Evaluation and Treatment: Rehabilitation  ONSET DATE: 6 months or more  SUBJECTIVE:   SUBJECTIVE STATEMENT: Balance is not as bad as it was since she got put on RA medications. Patient states balance is really not bad anymore.   PERTINENT HISTORY: DM, HTN, HLD, hypothyroidism, RA PAIN:  Are you having pain? No  PRECAUTIONS: None  WEIGHT BEARING RESTRICTIONS: No  FALLS:  Has patient fallen in last 6 months? No  OCCUPATION: Retired  PLOF: Independent  PATIENT GOALS: get checked over    OBJECTIVE:   DIAGNOSTIC FINDINGS: MRI Brain 08/17/22 IMPRESSION: No cause of the presenting symptoms is identified. Mild generalized age  related volume loss without lobar predominance. Single punctate subcortical white matter stroke in the left posterior frontal lobe, unlikely to be of any clinical significance and representative of less vascular disease than is usually seen in persons of this age.   COGNITION: Overall cognitive status: Within functional limits for tasks assessed     SENSATION: WFL   POSTURE: rounded shoulders and forward head   LOWER EXTREMITY ROM: WFL for tasks assessed  Active ROM Right eval Left eval  Hip flexion    Hip extension    Hip abduction    Hip adduction    Hip internal rotation    Hip external rotation    Knee flexion    Knee extension    Ankle dorsiflexion    Ankle plantarflexion    Ankle inversion    Ankle eversion     (Blank rows = not tested)  LOWER EXTREMITY MMT:  MMT Right eval Left eval  Hip flexion 4+ 4+  Hip extension    Hip abduction    Hip adduction    Hip internal rotation    Hip external rotation    Knee flexion 5 5  Knee extension 5 5  Ankle dorsiflexion 5 5  Ankle plantarflexion    Ankle inversion    Ankle eversion     (Blank rows = not tested)    FUNCTIONAL TESTS:  2  minute walk test: 420 feet  GAIT: Distance walked: 420 feet Assistive device utilized: None Level of assistance: Complete Independence Comments:  DGI 1. Gait level surface (3) Normal: Walks 20', no assistive devices, good sped, no evidence for imbalance, normal gait pattern 2. Change in gait speed (3) Normal: Able to smoothly change walking speed without loss of balance or gait deviation. Shows a significant difference in walking speeds between normal, fast and slow speeds. 3. Gait with horizontal head turns (3) Normal: Performs head turns smoothly with no change in gait. 4. Gait with vertical head turns (2) Mild Impairment: Performs head turns smoothly with slight change in gait velocity, i.e., minor disruption to smooth gait path or uses walking aid. 5. Gait and  pivot turn (3) Normal: Pivot turns safely within 3 seconds and stops quickly with no loss of balance. 6. Step over obstacle (3) Normal: Is able to step over the box without changing gait speed, no evidence of imbalance. 7. Step around obstacles (3) Normal: Is able to walk around cones safely without changing gait speed; no evidence of imbalance. 8. Stairs (3) Normal: Alternating feet, no rail.  TOTAL SCORE: 23 / 24    TODAY'S TREATMENT:                                                                                                                              DATE:  12/10/22 EVAL    PATIENT EDUCATION:  Education details: Patient educated on exam findings, POC, scope of PT,  and returning to PT if needed. Person educated: Patient Education method: Explanation, Demonstration, and Handouts Education comprehension: verbalized understanding, returned demonstration, verbal cues required, and tactile cues required  HOME EXERCISE PROGRAM: Remaining active  ASSESSMENT:  CLINICAL IMPRESSION: Patient a 77 y.o. y.o. female who was seen today for physical therapy evaluation and treatment for imbalance. Patient with reduction in symptoms since beginning new medication for RA. Patient functioning without limitation with ADL but is agreeable to PT evaluation. Patient with minimal to no balance, strength, and functional mobility deficits. Patient educated on exam findings and returning to PT if needed. Patient does not require additional PT services at this time.   OBJECTIVE IMPAIRMENTS: Abnormal gait, decreased balance, and improper body mechanics.   ACTIVITY LIMITATIONS: locomotion level  PARTICIPATION LIMITATIONS: community activity and yard work  PERSONAL FACTORS: 1-2 comorbidities: RA, DM  are also affecting patient's functional outcome.   REHAB POTENTIAL: Good  CLINICAL DECISION MAKING: Stable/uncomplicated  EVALUATION COMPLEXITY: Low   GOALS: Goals reviewed with patient?  Yes  SHORT TERM GOALS: Target date: 12/10/22  Patient will be educated on exam findings and returning to PT if needed.  Baseline: Goal status: MET    PLAN:  PT FREQUENCY: one time visit  PT DURATION: other: 1 visit  PLANNED INTERVENTIONS: Therapeutic exercises, Therapeutic activity, Neuromuscular re-education, Balance training, Gait training, Patient/Family education, Joint manipulation, Joint mobilization, Stair training, Orthotic/Fit training, DME instructions, Aquatic Therapy, Dry Needling,  Electrical stimulation, Spinal manipulation, Spinal mobilization, Cryotherapy, Moist heat, Compression bandaging, scar mobilization, Splintting, Taping, Traction, Ultrasound, Ionotophoresis 4mg /ml Dexamethasone, and Manual therapy  PLAN FOR NEXT SESSION: n/a   Reola Mosher [facility], PT 12/10/2022, 11:40 AM

## 2022-12-16 ENCOUNTER — Other Ambulatory Visit: Payer: Self-pay

## 2022-12-16 ENCOUNTER — Ambulatory Visit (HOSPITAL_COMMUNITY): Payer: Medicare Other | Admitting: Occupational Therapy

## 2022-12-16 ENCOUNTER — Encounter (HOSPITAL_COMMUNITY): Payer: Self-pay | Admitting: Occupational Therapy

## 2022-12-16 DIAGNOSIS — R278 Other lack of coordination: Secondary | ICD-10-CM

## 2022-12-16 DIAGNOSIS — R29898 Other symptoms and signs involving the musculoskeletal system: Secondary | ICD-10-CM | POA: Diagnosis not present

## 2022-12-16 DIAGNOSIS — R2689 Other abnormalities of gait and mobility: Secondary | ICD-10-CM | POA: Diagnosis not present

## 2022-12-16 NOTE — Patient Instructions (Signed)

## 2022-12-16 NOTE — Therapy (Signed)
OUTPATIENT OCCUPATIONAL THERAPY ORTHO EVALUATION  Patient Name: Jaime Matthews MRN: [id number] DOB:1945/08/06, 77 y.o., female Today's Date: 12/16/2022  PCP: Terie Purser, PA-C REFERRING PROVIDER: Dr. Shon Millet  END OF SESSION:  OT End of Session - 12/16/22 0940     Visit Number 1    Number of Visits 4    Date for OT Re-Evaluation 01/15/23    Authorization Type [employer] Medicare, $20 copay    Progress Note Due on Visit 10    OT Start Time 0900    OT Stop Time 0939    OT Time Calculation (min) 39 min    Activity Tolerance Patient tolerated treatment well    Behavior During Therapy WFL for tasks assessed/performed             Past Medical History:  Diagnosis Date   Cirrhosis (HCC)    Diabetes (HCC)    diagosed around 2010.    Hyperlipidemia    Hypothyroidism    Past Surgical History:  Procedure Laterality Date   ABDOMINAL HYSTERECTOMY     APPENDECTOMY     BUNIONECTOMY     TONSILLECTOMY     Patient Active Problem List   Diagnosis Date Noted   Solitary pulmonary nodule 11/19/2020   Hepatic cirrhosis (HCC) 05/14/2020   Abnormal liver ultrasound 12/19/2019   Abnormal LFTs 12/19/2019   Pancreatic cyst 12/19/2019    ONSET DATE: ~6 months  REFERRING DIAG: left hand weakness  THERAPY DIAG:  Other symptoms and signs involving the musculoskeletal system  Other lack of coordination  Rationale for Evaluation and Treatment: Rehabilitation  SUBJECTIVE:   SUBJECTIVE STATEMENT: S: I can't crochet or quilt.  Pt accompanied by: self  PERTINENT HISTORY: Pt is a 77 y/o female presenting with left hand weakness for approximately 6 months. Pt has had an EMG which shows nerve impingement from C8-T1. Pt presents with muscle atrophy in the left hand, significant weakness compared to RUE.   PRECAUTIONS: None  WEIGHT BEARING RESTRICTIONS: No  PAIN:  Are you having pain? No  FALLS: Has patient fallen in last 6 months? No  PLOF: Independent  PATIENT GOALS: To  be able to do more with the left hand.   OBJECTIVE:   HAND DOMINANCE: Right  ADLs: Overall ADLs: Pt reports difficulty with crocheting, quilting, buttoning and zipping, opening and closing ziplock back. Tying shoes, putting on earrings, operating necklace clasps are difficult.   FUNCTIONAL OUTCOME MEASURES: Quick Dash: 31.82  UPPER EXTREMITY ROM:      BUE A/ROM is WNL throughout.   UPPER EXTREMITY MMT:     MMT Left eval  Elbow flexion 5/5  Elbow extension 5/5  Wrist flexion 5/5  Wrist extension 5/5  Wrist ulnar deviation 5/5  Wrist radial deviation 5/5  Wrist pronation 5/5  Wrist supination 5/5  (Blank rows = not tested)  HAND FUNCTION: Grip strength: Right: 75 lbs; Left: 28 lbs, Lateral pinch: Right: 15 lbs, Left: 2 lbs, and 3 point pinch: Right: 8 lbs, Left: 3 lbs  COORDINATION: 9 Hole Peg test: Right: 19.55 sec; Left: 25.96 sec  SENSATION: WFL  COGNITION: Overall cognitive status: Within functional limits for tasks assessed  OBSERVATIONS: intrinsic atrophy   TODAY'S TREATMENT:  DATE:  12/16/22 -Educated on button hook, demonstrated for pt then pt practicing successfully -Educated on built-up utensils, provided red foam to use on home utensils  PATIENT EDUCATION: Education details: yellow theraputty hand strengthening, AE purchase options Person educated: Patient Education method: Explanation, Demonstration, and Handouts Education comprehension: verbalized understanding and returned demonstration  HOME EXERCISE PROGRAM: Eval: yellow theraputty hand strengthening   GOALS: Goals reviewed with patient? Yes  SHORT TERM GOALS: Target date: 01/14/23  Pt provided with and educated on HEP to improve hand strength required for functional use during ADLs.   Goal status: INITIAL  2.  Pt will improve left hand strength by 10# and pinch  strength by 3# to improve ability to grasp and hold utensils and household objects during ADLs.   Goal status: INITIAL  3.  Pt will be educated on AE available for use to improve ability to hold and manipulate small items during functional tasks.   Goal status: INITIAL   ASSESSMENT:  CLINICAL IMPRESSION: Patient is a 76 y.o. female who was seen today for occupational therapy evaluation for left hand weakness. Pt has had an EMG showing impingement in C8-T1, and has notable muscle atrophy in the thenar eminence and intrinsic musculature of the left hand. Pt reports difficulty with manipulating small items such as buttons, earrings, necklace clasps, etc., and has difficulty with holding objects with the left hand. Educated on button hook and built up utensils today and provided HEP. Pt will benefit from skilled OT services to improve strength and functional use of the left hand, as well as educate on AE and adaptive techniques for ADL completion.   PERFORMANCE DEFICITS: in functional skills including ADLs, IADLs, coordination, strength, and UE functional use  IMPAIRMENTS: are limiting patient from ADLs, IADLs, and leisure.   CO-MORBIDITIES: has no other co-morbidities that affects occupational performance. Patient will benefit from skilled OT to address above impairments and improve overall function.  MODIFICATION OR ASSISTANCE TO COMPLETE EVALUATION: No modification of tasks or assist necessary to complete an evaluation.  OT OCCUPATIONAL PROFILE AND HISTORY: Problem focused assessment: Including review of records relating to presenting problem.  CLINICAL DECISION MAKING: LOW - limited treatment options, no task modification necessary  REHAB POTENTIAL: Good  EVALUATION COMPLEXITY: Low    PLAN:  OT FREQUENCY: 1x/week  OT DURATION: 4 weeks  PLANNED INTERVENTIONS: self care/ADL training, therapeutic exercise, therapeutic activity, and patient/family education  RECOMMENDED OTHER  SERVICES: None  CONSULTED AND AGREED WITH PLAN OF CARE: Patient  PLAN FOR NEXT SESSION: Follow up on HEP, trial crochet ring, begin grip and pinch strengthening   Ezra Sites, OTR/L  [phone] 12/16/2022, 9:48 AM

## 2022-12-23 DIAGNOSIS — R5383 Other fatigue: Secondary | ICD-10-CM | POA: Diagnosis not present

## 2022-12-23 DIAGNOSIS — M79642 Pain in left hand: Secondary | ICD-10-CM | POA: Diagnosis not present

## 2022-12-23 DIAGNOSIS — M79641 Pain in right hand: Secondary | ICD-10-CM | POA: Diagnosis not present

## 2022-12-23 DIAGNOSIS — M2540 Effusion, unspecified joint: Secondary | ICD-10-CM | POA: Diagnosis not present

## 2022-12-23 DIAGNOSIS — M2560 Stiffness of unspecified joint, not elsewhere classified: Secondary | ICD-10-CM | POA: Diagnosis not present

## 2022-12-23 DIAGNOSIS — M0609 Rheumatoid arthritis without rheumatoid factor, multiple sites: Secondary | ICD-10-CM | POA: Diagnosis not present

## 2022-12-25 ENCOUNTER — Ambulatory Visit (HOSPITAL_COMMUNITY): Payer: Medicare Other | Admitting: Occupational Therapy

## 2022-12-25 ENCOUNTER — Encounter (HOSPITAL_COMMUNITY): Payer: Self-pay | Admitting: Occupational Therapy

## 2022-12-25 DIAGNOSIS — R29898 Other symptoms and signs involving the musculoskeletal system: Secondary | ICD-10-CM

## 2022-12-25 DIAGNOSIS — R278 Other lack of coordination: Secondary | ICD-10-CM

## 2022-12-25 DIAGNOSIS — R2689 Other abnormalities of gait and mobility: Secondary | ICD-10-CM | POA: Diagnosis not present

## 2022-12-25 NOTE — Therapy (Signed)
OUTPATIENT OCCUPATIONAL THERAPY ORTHO TREATMENT  Patient Name: Jaime Matthews MRN: [id number] DOB:1946-05-12, 77 y.o., female Today's Date: 12/25/2022  PCP: Terie Purser, PA-C REFERRING PROVIDER: Dr. Shon Millet  END OF SESSION:  OT End of Session - 12/25/22 1027     Visit Number 2    Number of Visits 4    Date for OT Re-Evaluation 01/15/23    Authorization Type [employer] Medicare, $20 copay    Progress Note Due on Visit 10    OT Start Time 0950    OT Stop Time 1029    OT Time Calculation (min) 39 min    Activity Tolerance Patient tolerated treatment well    Behavior During Therapy WFL for tasks assessed/performed              Past Medical History:  Diagnosis Date   Cirrhosis (HCC)    Diabetes (HCC)    diagosed around 2010.    Hyperlipidemia    Hypothyroidism    Past Surgical History:  Procedure Laterality Date   ABDOMINAL HYSTERECTOMY     APPENDECTOMY     BUNIONECTOMY     TONSILLECTOMY     Patient Active Problem List   Diagnosis Date Noted   Solitary pulmonary nodule 11/19/2020   Hepatic cirrhosis (HCC) 05/14/2020   Abnormal liver ultrasound 12/19/2019   Abnormal LFTs 12/19/2019   Pancreatic cyst 12/19/2019    ONSET DATE: ~6 months  REFERRING DIAG: left hand weakness  THERAPY DIAG:  Other symptoms and signs involving the musculoskeletal system  Other lack of coordination  Rationale for Evaluation and Treatment: Rehabilitation  SUBJECTIVE:   SUBJECTIVE STATEMENT: S: I got a button hook.   PERTINENT HISTORY: Pt is a 77 y/o female presenting with left hand weakness for approximately 6 months. Pt has had an EMG which shows nerve impingement from C8-T1. Pt presents with muscle atrophy in the left hand, significant weakness compared to RUE.   PRECAUTIONS: None  WEIGHT BEARING RESTRICTIONS: No  PAIN:  Are you having pain? Yes: NPRS scale: 2/10 Pain location: neck Pain description: aching Aggravating factors: movement Relieving factors:  being still  FALLS: Has patient fallen in last 6 months? No  PLOF: Independent  PATIENT GOALS: To be able to do more with the left hand.   OBJECTIVE:   HAND DOMINANCE: Right  ADLs: Overall ADLs: Pt reports difficulty with crocheting, quilting, buttoning and zipping, opening and closing ziplock back. Tying shoes, putting on earrings, operating necklace clasps are difficult.   FUNCTIONAL OUTCOME MEASURES: Quick Dash: 31.82  UPPER EXTREMITY ROM:      BUE A/ROM is WNL throughout.   UPPER EXTREMITY MMT:     MMT Left eval  Elbow flexion 5/5  Elbow extension 5/5  Wrist flexion 5/5  Wrist extension 5/5  Wrist ulnar deviation 5/5  Wrist radial deviation 5/5  Wrist pronation 5/5  Wrist supination 5/5  (Blank rows = not tested)  HAND FUNCTION: Grip strength: Right: 75 lbs; Left: 28 lbs, Lateral pinch: Right: 15 lbs, Left: 2 lbs, and 3 point pinch: Right: 8 lbs, Left: 3 lbs  OBSERVATIONS: intrinsic atrophy   TODAY'S TREATMENT:  DATE:  12/25/22 -Grip strengthening: large beads gripper vertical at 25#, medium beads gripper vertical at 25#, 1 small bead gripper vertical at 20# -Red theraputty: flatten in standing, cutting circles with pvc pipe, rolling, gripping-pronated and supinated, pinching-3 point and lateral -Pinch tree: pt moving all yellow, red, and 5 green pins to the vertical bar using 3 point pinch, removing using lateral pinch. Unable to operate blue or black pins due to weakness, fatigued after 5 green pins and unable to complete last 2 green pins -Soft cone squeeze: 10x5" holds -Grooved pegboard: pt holding half of the pegs in her palm while translating one peg to the fingertips and placing in pegboard. Working on sustained grasp and pinch. Placed 10 pegs   12/16/22 -Educated on button hook, demonstrated for pt then pt practicing  successfully -Educated on built-up utensils, provided red foam to use on home utensils  PATIENT EDUCATION: Education details: reviewed HEP, clothespins and cotton balls Person educated: Patient Education method: Programmer, multimedia, Demonstration, and Handouts Education comprehension: verbalized understanding and returned demonstration  HOME EXERCISE PROGRAM: Eval: yellow theraputty hand strengthening 6/20: use clothespins to pick up and move cotton balls   GOALS: Goals reviewed with patient? Yes  SHORT TERM GOALS: Target date: 01/14/23  Pt provided with and educated on HEP to improve hand strength required for functional use during ADLs.   Goal status: IN PROGRESS  2.  Pt will improve left hand strength by 10# and pinch strength by 3# to improve ability to grasp and hold utensils and household objects during ADLs.   Goal status: IN PROGRESS  3.  Pt will be educated on AE available for use to improve ability to hold and manipulate small items during functional tasks.   Goal status: IN PROGRESS   ASSESSMENT:  CLINICAL IMPRESSION: Patient reports she purchased a button hook and has been practicing her HEP. OT ordered crochet hook but it has not arrived yet. Initiated grip and pinch strengthening, pt completing hand gripper task, OT notes more difficulty with horizontal position than vertical position. Upgraded to red theraputty during session, pt manipulating well. Completing pinch tasks, pt noted to have more weakness with lateral pinch, although 3 point pinch is also weak. Verbal cuing for form and technique, as well as modifying tasks as needed. Rest breaks provided as needed.   PERFORMANCE DEFICITS: in functional skills including ADLs, IADLs, coordination, strength, and UE functional use    PLAN:  OT FREQUENCY: 1x/week  OT DURATION: 4 weeks  PLANNED INTERVENTIONS: self care/ADL training, therapeutic exercise, therapeutic activity, and patient/family education  CONSULTED AND  AGREED WITH PLAN OF CARE: Patient  PLAN FOR NEXT SESSION: trial crochet ring when it arrives, continue grip and pinch strengthening   Ezra Sites, OTR/L  [phone] 12/25/2022, 10:29 AM

## 2022-12-30 ENCOUNTER — Ambulatory Visit (HOSPITAL_COMMUNITY): Payer: Medicare Other | Admitting: Occupational Therapy

## 2022-12-30 ENCOUNTER — Encounter (HOSPITAL_COMMUNITY): Payer: Self-pay | Admitting: Occupational Therapy

## 2022-12-30 DIAGNOSIS — R278 Other lack of coordination: Secondary | ICD-10-CM

## 2022-12-30 DIAGNOSIS — R29898 Other symptoms and signs involving the musculoskeletal system: Secondary | ICD-10-CM | POA: Diagnosis not present

## 2022-12-30 DIAGNOSIS — R2689 Other abnormalities of gait and mobility: Secondary | ICD-10-CM | POA: Diagnosis not present

## 2022-12-30 NOTE — Therapy (Signed)
OUTPATIENT OCCUPATIONAL THERAPY ORTHO TREATMENT  Patient Name: Jaime Matthews MRN: [id number] DOB:May 16, 1946, 77 y.o., female Today's Date: 12/30/2022  PCP: Terie Purser, PA-C REFERRING PROVIDER: Dr. Shon Millet  END OF SESSION:  OT End of Session - 12/30/22 1100     Visit Number 3    Number of Visits 4    Date for OT Re-Evaluation 01/15/23    Authorization Type [employer] Medicare, $20 copay    Progress Note Due on Visit 10    OT Start Time 0905    OT Stop Time 0946    OT Time Calculation (min) 41 min    Activity Tolerance Patient tolerated treatment well    Behavior During Therapy [facility] for tasks assessed/performed              Past Medical History:  Diagnosis Date   Cirrhosis (HCC)    Diabetes (HCC)    diagosed around 2010.    Hyperlipidemia    Hypothyroidism    Past Surgical History:  Procedure Laterality Date   ABDOMINAL HYSTERECTOMY     APPENDECTOMY     BUNIONECTOMY     TONSILLECTOMY     Patient Active Problem List   Diagnosis Date Noted   Solitary pulmonary nodule 11/19/2020   Hepatic cirrhosis (HCC) 05/14/2020   Abnormal liver ultrasound 12/19/2019   Abnormal LFTs 12/19/2019   Pancreatic cyst 12/19/2019    ONSET DATE: ~6 months  REFERRING DIAG: left hand weakness  THERAPY DIAG:  Other symptoms and signs involving the musculoskeletal system  Other lack of coordination  Rationale for Evaluation and Treatment: Rehabilitation  SUBJECTIVE:   SUBJECTIVE STATEMENT: S: "Things have been going alright"   PERTINENT HISTORY: Pt is a 77 y/o female presenting with left hand weakness for approximately 6 months. Pt has had an EMG which shows nerve impingement from C8-T1. Pt presents with muscle atrophy in the left hand, significant weakness compared to RUE.   PRECAUTIONS: None  WEIGHT BEARING RESTRICTIONS: No  PAIN:  Are you having pain? No  FALLS: Has patient fallen in last 6 months? No  PLOF: Independent  PATIENT GOALS: To be able to do  more with the left hand.   OBJECTIVE:   HAND DOMINANCE: Right  ADLs: Overall ADLs: Pt reports difficulty with crocheting, quilting, buttoning and zipping, opening and closing ziplock back. Tying shoes, putting on earrings, operating necklace clasps are difficult.   FUNCTIONAL OUTCOME MEASURES: Quick Dash: 31.82  UPPER EXTREMITY ROM:      BUE A/ROM is WNL throughout.   UPPER EXTREMITY MMT:     MMT Left eval  Elbow flexion 5/5  Elbow extension 5/5  Wrist flexion 5/5  Wrist extension 5/5  Wrist ulnar deviation 5/5  Wrist radial deviation 5/5  Wrist pronation 5/5  Wrist supination 5/5  (Blank rows = not tested)  HAND FUNCTION: Grip strength: Right: 75 lbs; Left: 28 lbs, Lateral pinch: Right: 15 lbs, Left: 2 lbs, and 3 point pinch: Right: 8 lbs, Left: 3 lbs  OBSERVATIONS: intrinsic atrophy   TODAY'S TREATMENT:  DATE:  12/30/22 -Wrist strengthening: 2lb dumbbell, flexion/extension, supination/pronation, ulnar/radial deviation, x10 -Gripping green weighted ball spelling ABC's in the air -Towel scrunches x6 -Wringing out towel x10 -Pinch Strengthening:  started with red resistance clip, pinching and stacking 6 cubes, tripod pinch x1, then lateral pinch x2 -Sponges: x18, x20 -Digiflex, 3lbs, full squeeze x10, each digit x6 -Grip Strengthening: 20# 10 beads  12/25/22 -Grip strengthening: large beads gripper vertical at 25#, medium beads gripper vertical at 25#, 1 small bead gripper vertical at 20# -Red theraputty: flatten in standing, cutting circles with pvc pipe, rolling, gripping-pronated and supinated, pinching-3 point and lateral -Pinch tree: pt moving all yellow, red, and 5 green pins to the vertical bar using 3 point pinch, removing using lateral pinch. Unable to operate blue or black pins due to weakness, fatigued after 5 green pins and unable to  complete last 2 green pins -Soft cone squeeze: 10x5" holds -Grooved pegboard: pt holding half of the pegs in her palm while translating one peg to the fingertips and placing in pegboard. Working on sustained grasp and pinch. Placed 10 pegs   12/16/22 -Educated on button hook, demonstrated for pt then pt practicing successfully -Educated on built-up utensils, provided red foam to use on home utensils  PATIENT EDUCATION: Education details: Wrist strengthening and towel scrunches Person educated: Patient Education method: Programmer, multimedia, Demonstration, and Handouts Education comprehension: verbalized understanding and returned demonstration  HOME EXERCISE PROGRAM: Eval: yellow theraputty hand strengthening 6/20: use clothespins to pick up and move cotton balls 6/25: Wrist strengthening and towel scrunches   GOALS: Goals reviewed with patient? Yes  SHORT TERM GOALS: Target date: 01/14/23  Pt provided with and educated on HEP to improve hand strength required for functional use during ADLs.   Goal status: IN PROGRESS  2.  Pt will improve left hand strength by 10# and pinch strength by 3# to improve ability to grasp and hold utensils and household objects during ADLs.   Goal status: IN PROGRESS  3.  Pt will be educated on AE available for use to improve ability to hold and manipulate small items during functional tasks.   Goal status: IN PROGRESS   ASSESSMENT:  CLINICAL IMPRESSION: This session, pt reporting no pain, however feels like her weakness is still the same. She reports completing her HEP daily, with most difficulty during pinching activities. This session she continued to work on maintaining her grip with heavier objects, as well as pinching and manipulating objects to mimic functional tasks. Her hand continues to fatigue with pinching/fine motor tasks, requiring frequent breaks. Verbal and visual cuing provided throughout for positioning and technique.   PERFORMANCE  DEFICITS: in functional skills including ADLs, IADLs, coordination, strength, and UE functional use    PLAN:  OT FREQUENCY: 1x/week  OT DURATION: 4 weeks  PLANNED INTERVENTIONS: self care/ADL training, therapeutic exercise, therapeutic activity, and patient/family education  CONSULTED AND AGREED WITH PLAN OF CARE: Patient  PLAN FOR NEXT SESSION: trial crochet ring when it arrives, continue grip and pinch strengthening   Trish Mage, OTR/L [phone] 12/30/2022, 11:01 AM

## 2022-12-30 NOTE — Patient Instructions (Signed)
Strengthening Exercises  1) WRIST EXTENSION CURLS - TABLE  Hold a small free weight, rest your forearm on a table and bend your wrist up and down with your palm face down as shown.      2) WRIST FLEXION CURLS - TABLE  Hold a small free weight, rest your forearm on a table and bend your wrist up and down with your palm face up as shown.     3) FREE WEIGHT RADIAL/ULNAR DEVIATION - TABLE  Hold a small free weight, rest your forearm on a table and bend your wrist up and down with your palm facing towards the side as shown.     4) Pronation  Forearm supported on table with wrist in neutral position. Using a weight, roll wrist so that palm faces downward. Hold for 2 seconds and return to starting position.     5) Supination  Forearm supported on table with wrist in neutral position. Using a weight, roll wrist so that palm is now facing upward. Hold for 2 seconds and return to starting position.      *Complete exercises using ____ pound weight, ____times each, ____times per day*   Complete each exercise 10-15X, 2-3X/day  1) Towel crunch Place a small towel on a firm table top. Flatten out the towel and then place your hand on one end of it.  Next, flex your fingers 2-5 (index finger through pinky finger) as you pull the towel towards your hand.

## 2023-01-05 ENCOUNTER — Ambulatory Visit: Payer: Medicare Other | Admitting: Gastroenterology

## 2023-01-05 ENCOUNTER — Telehealth: Payer: Self-pay | Admitting: *Deleted

## 2023-01-05 ENCOUNTER — Encounter: Payer: Self-pay | Admitting: *Deleted

## 2023-01-05 ENCOUNTER — Encounter: Payer: Self-pay | Admitting: Gastroenterology

## 2023-01-05 VITALS — BP 116/73 | HR 73 | Temp 97.6°F | Ht 65.0 in | Wt 161.6 lb

## 2023-01-05 DIAGNOSIS — K862 Cyst of pancreas: Secondary | ICD-10-CM | POA: Diagnosis not present

## 2023-01-05 DIAGNOSIS — K7460 Unspecified cirrhosis of liver: Secondary | ICD-10-CM

## 2023-01-05 DIAGNOSIS — R911 Solitary pulmonary nodule: Secondary | ICD-10-CM | POA: Diagnosis not present

## 2023-01-05 NOTE — Progress Notes (Signed)
GI Office Note    Referring Provider: Avis Epley, PA* Primary Care Physician:  Ladon Applebaum  Primary Gastroenterologist: Roetta Sessions, MD   Chief Complaint   Chief Complaint  Patient presents with   Follow-up    Doing well    History of Present Illness   Jaime Matthews is a 77 y.o. female presenting today for follow up. Last seen 05/2022. She has a history of abnormal LFTs with previous ultrasound showing somewhat nodular and coarse liver concerning for cirrhosis. Also with cyst in the body of the pancreas measuring 7 x 4 mm on ultrasound. Previous work-up showed no evidence of autoimmune hepatitis/PBC. She is immune to hepatitis A but not to hepatitis B. Vaccinations previously recommended. Ferritin previously elevated in the 400 range with normal iron saturations, improved to 265 on repeat. HFE genetic markers negative. 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   CT abdomen with and without contrast June 2023: Cirrhotic liver, no suspicious liver lesions.  Small cystic lesion of the body of the pancreas measuring 7 mm, unchanged in size compared to 2021 study.  Spleen normal in size.  MRI/MRCP or pancreatic protocol CT recommended in 2 years for surveillance of pancreatic lesion.  She also had mild wall thickening and fat stranding of the right colon, likely due to portal colopathy, colitis or other lesion cannot be excluded.  Colonoscopy was offered but declined.  Labs for celiac, autoimmune liver processes were negative.   Was due for CT chest for follow up pulmonary nodule in 07/2022 but didn't realize it. She does not recall receiving her recall letter. Due for pancreatic imaging in 12/2023. She is willing to have MRI as she recently tolerated large bore MRI at [facility]. We will switch her recall to MRI 12/2023. Due for labs and RUQ U/S for hepatoma screening.   She denies abdominal pain, edema, pruritus. No heartburn, n/v, constipation, diarrhea, melena,  brbpr.    RUQ U/S 06/2022: IMPRESSION: 1. Cholelithiasis without secondary signs of acute cholecystitis. 2. Morphologic changes to the liver suggestive of cirrhosis. No focal lesion.   Medications   Current Outpatient Medications  Medication Sig Dispense Refill   aspirin EC 81 MG tablet Take 81 mg by mouth daily. Swallow whole.     atorvastatin (LIPITOR) 10 MG tablet Take 1 tablet by mouth daily.     docusate sodium (COLACE) 100 MG capsule Take 200 mg by mouth daily.     Dulaglutide (TRULICITY) 1.5 MG/0.5ML SOPN Inject into the skin every 7 (seven) days.     hydroxychloroquine (PLAQUENIL) 200 MG tablet Take 200 mg by mouth daily. 1.5 day     levothyroxine (SYNTHROID) 50 MCG tablet Take 1 tablet by mouth daily.     lisinopril (ZESTRIL) 2.5 MG tablet Take 1 tablet by mouth daily.     Multiple Vitamins-Minerals (CENTRUM SILVER 50+WOMEN) TABS Take 1 tablet by mouth daily.     Omega-3 Fatty Acids (FISH OIL) 1200 MG CAPS Take 2 capsules by mouth daily.     ONETOUCH ULTRA test strip USE TO TEST BLOODSUGAR TWICE DAILY     No current facility-administered medications for this visit.    Allergies   Allergies as of 01/05/2023   (No Known Allergies)        Review of Systems   General: Negative for anorexia, weight loss, fever, chills, fatigue, weakness. ENT: Negative for hoarseness, difficulty swallowing , nasal congestion. CV: Negative for chest pain, angina, palpitations, dyspnea on exertion,  peripheral edema.  Respiratory: Negative for dyspnea at rest, dyspnea on exertion, cough, sputum, wheezing.  GI: See history of present illness. GU:  Negative for dysuria, hematuria, urinary incontinence, urinary frequency, nocturnal urination.  Endo: Negative for unusual weight change.     Physical Exam   BP 116/73 (BP Location: Right Arm, Patient Position: Sitting, Cuff Size: Large)   Pulse 73   Temp 97.6 F (36.4 C) (Oral)   Ht 5\' 5"  (1.651 m)   Wt 161 lb 9.6 oz (73.3 kg)   SpO2  98%   BMI 26.89 kg/m    General: Well-nourished, well-developed in no acute distress.  Eyes: No icterus. Mouth: Oropharyngeal mucosa moist and pink   Lungs: Clear to auscultation bilaterally.   Abdomen: Bowel sounds are normal, nontender, nondistended, no hepatosplenomegaly or masses,  no abdominal bruits or hernia , no rebound or guarding.  Rectal: not performed Extremities: No lower extremity edema. No clubbing or deformities. Neuro: Alert and oriented x 4   Skin: Warm and dry, no jaundice.   Psych: Alert and cooperative, normal mood and affect.  Labs   Lab Results  Component Value Date   VITAMINB12 1,184 (H) 09/26/2022   Lab Results  Component Value Date   HGBA1C 7.1 (H) 09/26/2022   Lab Results  Component Value Date   WBC 5.9 06/06/2022   HGB 12.2 06/06/2022   HCT 36.1 06/06/2022   MCV 95 06/06/2022   PLT 133 (L) 06/06/2022   Lab Results  Component Value Date   ALT 38 (H) 06/06/2022   AST 33 06/06/2022   ALKPHOS 99 06/06/2022   BILITOT 0.7 06/06/2022   Lab Results  Component Value Date   INR 1.1 06/06/2022   INR 1.1 11/28/2021   INR 1.1 07/31/2021   Lab Results  Component Value Date   IRON 126 06/06/2022   TIBC 324 06/06/2022   FERRITIN 381 (H) 06/06/2022   Lab Results  Component Value Date   CREATININE 0.93 06/06/2022   BUN 21 06/06/2022   NA 142 06/06/2022   K 4.2 06/06/2022   CL 106 06/06/2022   CO2 21 06/06/2022    Imaging Studies   No results found.  Assessment   Cirrhosis: due for labs and hepatoma screening. Has been well compensated. 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   Pancreatic cysts: due for imaging in 12/2023. Patient recently tolerated large bore MRI therefore would like to pursue next pancreatic imaging via MRI if possible.  Pulmonary nodule: overdue for surveillance imaging.    PLAN   RUQ U/S CMET, CBC, PT/INR, AFP, ferritin/iron/tibc. MRI/MRCP in 12/2023 for pancreatic cysts. Patient able to complete in large bore at  [facility] imaging. CT Chest, noncontrast for pulmonary nodule.   Leanna Battles. Melvyn Neth, MHS, PA-C [facility]

## 2023-01-05 NOTE — Telephone Encounter (Signed)
[employer] PA: CPT Code 47829 Description: CT THORAX W/O CONTRAST Case Number: [id number] Review Date: 01/05/2023 3:42:22 PM Expiration Date: N/A Status: This member's benefit plan did not require a prior authorization for this request

## 2023-01-05 NOTE — Patient Instructions (Signed)
CT chest to follow up on pulmonary nodule.  Ultrasound of your liver to screen for liver nodules. Update labs at least one week before your CT scan.  Next year you will be due follow up of pancreatic cysts. As discussed today, we will try MRI at [facility] instead of CT scan.  Call with any questions or concerns. We will be in touch with results.

## 2023-01-06 ENCOUNTER — Encounter (HOSPITAL_COMMUNITY): Payer: Self-pay | Admitting: Occupational Therapy

## 2023-01-06 ENCOUNTER — Ambulatory Visit (HOSPITAL_COMMUNITY): Payer: Medicare Other | Attending: Neurology | Admitting: Occupational Therapy

## 2023-01-06 DIAGNOSIS — R278 Other lack of coordination: Secondary | ICD-10-CM | POA: Diagnosis not present

## 2023-01-06 DIAGNOSIS — R911 Solitary pulmonary nodule: Secondary | ICD-10-CM | POA: Diagnosis not present

## 2023-01-06 DIAGNOSIS — R29898 Other symptoms and signs involving the musculoskeletal system: Secondary | ICD-10-CM | POA: Insufficient documentation

## 2023-01-06 DIAGNOSIS — K7460 Unspecified cirrhosis of liver: Secondary | ICD-10-CM | POA: Diagnosis not present

## 2023-01-06 DIAGNOSIS — K862 Cyst of pancreas: Secondary | ICD-10-CM | POA: Diagnosis not present

## 2023-01-06 NOTE — Therapy (Signed)
OUTPATIENT OCCUPATIONAL THERAPY ORTHO TREATMENT  Patient Name: Jaime Matthews MRN: [id number] DOB:1945-12-06, 77 y.o., female Today's Date: 01/06/2023  PCP: Terie Purser, PA-C REFERRING PROVIDER: Dr. Shon Millet  END OF SESSION:  OT End of Session - 01/06/23 1107     Visit Number 4    Number of Visits 5    Date for OT Re-Evaluation 01/15/23    Authorization Type [employer] Medicare, $20 copay    Progress Note Due on Visit 10    OT Start Time 1030    OT Stop Time 1109    OT Time Calculation (min) 39 min    Activity Tolerance Patient tolerated treatment well    Behavior During Therapy WFL for tasks assessed/performed               Past Medical History:  Diagnosis Date   Cirrhosis (HCC)    Diabetes (HCC)    diagosed around 2010.    Hyperlipidemia    Hypothyroidism    Past Surgical History:  Procedure Laterality Date   ABDOMINAL HYSTERECTOMY     APPENDECTOMY     BUNIONECTOMY     TONSILLECTOMY     Patient Active Problem List   Diagnosis Date Noted   Solitary pulmonary nodule 11/19/2020   Hepatic cirrhosis (HCC) 05/14/2020   Abnormal liver ultrasound 12/19/2019   Abnormal LFTs 12/19/2019   Pancreatic cyst 12/19/2019    ONSET DATE: ~6 months  REFERRING DIAG: left hand weakness  THERAPY DIAG:  Other symptoms and signs involving the musculoskeletal system  Other lack of coordination  Rationale for Evaluation and Treatment: Rehabilitation  SUBJECTIVE:   SUBJECTIVE STATEMENT: S: "They're going pretty good." (HEP)  PERTINENT HISTORY: Pt is a 77 y/o female presenting with left hand weakness for approximately 6 months. Pt has had an EMG which shows nerve impingement from C8-T1. Pt presents with muscle atrophy in the left hand, significant weakness compared to RUE.   PRECAUTIONS: None  WEIGHT BEARING RESTRICTIONS: No  PAIN:  Are you having pain? No  FALLS: Has patient fallen in last 6 months? No  PLOF: Independent  PATIENT GOALS: To be able to do  more with the left hand.   OBJECTIVE:   HAND DOMINANCE: Right  ADLs: Overall ADLs: Pt reports difficulty with crocheting, quilting, buttoning and zipping, opening and closing ziplock back. Tying shoes, putting on earrings, operating necklace clasps are difficult.   FUNCTIONAL OUTCOME MEASURES: Quick Dash: 31.82  UPPER EXTREMITY ROM:      BUE A/ROM is WNL throughout.   UPPER EXTREMITY MMT:     MMT Left eval  Elbow flexion 5/5  Elbow extension 5/5  Wrist flexion 5/5  Wrist extension 5/5  Wrist ulnar deviation 5/5  Wrist radial deviation 5/5  Wrist pronation 5/5  Wrist supination 5/5  (Blank rows = not tested)  HAND FUNCTION: Grip strength: Right: 75 lbs; Left: 28 lbs, Lateral pinch: Right: 15 lbs, Left: 2 lbs, and 3 point pinch: Right: 8 lbs, Left: 3 lbs  OBSERVATIONS: intrinsic atrophy   TODAY'S TREATMENT:  DATE:  01/06/23 -Provided crochet ring, assisted with technique -Pinch strengthening: pt using 3 point pinch, red and yellow clothespins to stack 4 towers of 4 sponges. Pt fatiguing with red clothespin after first tower, switched to yellow clothespin and lateral pinch, then used yellow and 3 point pinch. For final tower, pt attempting red clothespin, however was unable to complete and switched back to yellow and lateral pinch.  -Sponges: pt grasping high resistance sponges one at a time, holding 8 sponges at one time. Completed 3 rounds. -Grip strengthening: large beads gripper vertical at 25#, medium beads gripper vertical at 25# -Coin manipulation: pt holding coins in palm, working on in-hand manipulation to translate to fingertips, pinch, and drop into slotted container. Mod difficulty with manipulation. Dropping several coins during trials. Completed   12/30/22 -Wrist strengthening: 2lb dumbbell, flexion/extension, supination/pronation,  ulnar/radial deviation, x10 -Gripping green weighted ball spelling ABC's in the air -Towel scrunches x6 -Wringing out towel x10 -Pinch Strengthening:  started with red resistance clip, pinching and stacking 6 cubes, tripod pinch x1, then lateral pinch x2 -Sponges: x18, x20 -Digiflex, 3lbs, full squeeze x10, each digit x6 -Grip Strengthening: 20# 10 beads  12/25/22 -Grip strengthening: large beads gripper vertical at 25#, medium beads gripper vertical at 25#, 1 small bead gripper vertical at 20# -Red theraputty: flatten in standing, cutting circles with pvc pipe, rolling, gripping-pronated and supinated, pinching-3 point and lateral -Pinch tree: pt moving all yellow, red, and 5 green pins to the vertical bar using 3 point pinch, removing using lateral pinch. Unable to operate blue or black pins due to weakness, fatigued after 5 green pins and unable to complete last 2 green pins -Soft cone squeeze: 10x5" holds -Grooved pegboard: pt holding half of the pegs in her palm while translating one peg to the fingertips and placing in pegboard. Working on sustained grasp and pinch. Placed 10 pegs   12/16/22 -Educated on button hook, demonstrated for pt then pt practicing successfully -Educated on built-up utensils, provided red foam to use on home utensils  PATIENT EDUCATION: Education details: Wrist strengthening and towel scrunches Person educated: Patient Education method: Programmer, multimedia, Demonstration, and Handouts Education comprehension: verbalized understanding and returned demonstration  HOME EXERCISE PROGRAM: Eval: yellow theraputty hand strengthening 6/20: use clothespins to pick up and move cotton balls 6/25: Wrist strengthening and towel scrunches 7/2: manipulating coins, crochet ring   GOALS: Goals reviewed with patient? Yes  SHORT TERM GOALS: Target date: 01/14/23  Pt provided with and educated on HEP to improve hand strength required for functional use during ADLs.   Goal  status: IN PROGRESS  2.  Pt will improve left hand strength by 10# and pinch strength by 3# to improve ability to grasp and hold utensils and household objects during ADLs.   Goal status: IN PROGRESS  3.  Pt will be educated on AE available for use to improve ability to hold and manipulate small items during functional tasks.   Goal status: IN PROGRESS   ASSESSMENT:  CLINICAL IMPRESSION: Pt reporting putting her earrings in seems to be easier. Crochet ring arrived, OT assisting with donning and crochet technique. Pt will take home and practice. Began session with pinch strengthening, pt fatiguing with red clothespin, switching to red. Also alternating pinch techniques to reduce fatigue. Continued with grip strengthening, improvement in activity tolerance and reduced need for rest breaks. Added coin manipulation today, mod difficulty with maintaining grip on coins during placement. Verbal cuing for form and technique during tasks. Educated pt on options  available for jar openers, provided dycem to help with grip for stabilization when opening jars.   PERFORMANCE DEFICITS: in functional skills including ADLs, IADLs, coordination, strength, and UE functional use    PLAN:  OT FREQUENCY: 1x/week  OT DURATION: 4 weeks  PLANNED INTERVENTIONS: self care/ADL training, therapeutic exercise, therapeutic activity, and patient/family education  CONSULTED AND AGREED WITH PLAN OF CARE: Patient  PLAN FOR NEXT SESSION: Reassessment   Ezra Sites, OTR/L  [phone] 01/06/2023, 11:09 AM

## 2023-01-07 LAB — CBC WITH DIFFERENTIAL/PLATELET
Basophils Absolute: 0.1 10*3/uL (ref 0.0–0.2)
Basos: 1 %
EOS (ABSOLUTE): 0.2 10*3/uL (ref 0.0–0.4)
Eos: 3 %
Hematocrit: 35.7 % (ref 34.0–46.6)
Hemoglobin: 12.1 g/dL (ref 11.1–15.9)
Immature Grans (Abs): 0 10*3/uL (ref 0.0–0.1)
Immature Granulocytes: 0 %
Lymphocytes Absolute: 1.3 10*3/uL (ref 0.7–3.1)
Lymphs: 20 %
MCH: 32.2 pg (ref 26.6–33.0)
MCHC: 33.9 g/dL (ref 31.5–35.7)
MCV: 95 fL (ref 79–97)
Monocytes Absolute: 0.7 10*3/uL (ref 0.1–0.9)
Monocytes: 10 %
Neutrophils Absolute: 4.5 10*3/uL (ref 1.4–7.0)
Neutrophils: 66 %
Platelets: 145 10*3/uL — ABNORMAL LOW (ref 150–450)
RBC: 3.76 x10E6/uL — ABNORMAL LOW (ref 3.77–5.28)
RDW: 12.6 % (ref 11.7–15.4)
WBC: 6.8 10*3/uL (ref 3.4–10.8)

## 2023-01-07 LAB — COMPREHENSIVE METABOLIC PANEL
ALT: 56 IU/L — ABNORMAL HIGH (ref 0–32)
AST: 59 IU/L — ABNORMAL HIGH (ref 0–40)
Albumin: 4.2 g/dL (ref 3.8–4.8)
Alkaline Phosphatase: 93 IU/L (ref 44–121)
BUN/Creatinine Ratio: 18 (ref 12–28)
BUN: 17 mg/dL (ref 8–27)
Bilirubin Total: 0.8 mg/dL (ref 0.0–1.2)
CO2: 22 mmol/L (ref 20–29)
Calcium: 9.7 mg/dL (ref 8.7–10.3)
Chloride: 108 mmol/L — ABNORMAL HIGH (ref 96–106)
Creatinine, Ser: 0.95 mg/dL (ref 0.57–1.00)
Globulin, Total: 2.8 g/dL (ref 1.5–4.5)
Glucose: 92 mg/dL (ref 70–99)
Potassium: 4.6 mmol/L (ref 3.5–5.2)
Sodium: 143 mmol/L (ref 134–144)
Total Protein: 7 g/dL (ref 6.0–8.5)
eGFR: 62 mL/min/{1.73_m2} (ref >59)

## 2023-01-07 LAB — IRON,TIBC AND FERRITIN PANEL
Ferritin: 486 ng/mL — ABNORMAL HIGH (ref 15–150)
Iron Saturation: 30 % (ref 15–55)
Iron: 94 ug/dL (ref 27–139)
Total Iron Binding Capacity: 310 ug/dL (ref 250–450)
UIBC: 216 ug/dL (ref 118–369)

## 2023-01-07 LAB — PROTIME-INR
INR: 1.1 (ref 0.9–1.2)
Prothrombin Time: 11.9 s (ref 9.1–12.0)

## 2023-01-07 LAB — AFP TUMOR MARKER: AFP, Serum, Tumor Marker: 4.6 ng/mL (ref 0.0–9.2)

## 2023-01-12 ENCOUNTER — Encounter (HOSPITAL_COMMUNITY): Payer: Self-pay | Admitting: Occupational Therapy

## 2023-01-12 ENCOUNTER — Ambulatory Visit (HOSPITAL_COMMUNITY): Payer: Medicare Other | Admitting: Occupational Therapy

## 2023-01-12 DIAGNOSIS — R29898 Other symptoms and signs involving the musculoskeletal system: Secondary | ICD-10-CM | POA: Diagnosis not present

## 2023-01-12 DIAGNOSIS — R278 Other lack of coordination: Secondary | ICD-10-CM

## 2023-01-12 NOTE — Therapy (Signed)
OUTPATIENT OCCUPATIONAL THERAPY ORTHO REASSESSMENT & TREATMENT, DISCHARGE SUMMARY  Patient Name: Jaime Matthews MRN: [id number] DOB:Sep 26, 1945, 77 y.o., female Today's Date: 01/12/2023  PCP: Terie Purser, PA-C REFERRING PROVIDER: Dr. Shon Millet  END OF SESSION:  OT End of Session - 01/12/23 1138     Visit Number 5    Number of Visits 5    Date for OT Re-Evaluation 01/15/23    Authorization Type [employer] Medicare, $20 copay    Progress Note Due on Visit 10    OT Start Time 1120    OT Stop Time 1159    OT Time Calculation (min) 39 min    Activity Tolerance Patient tolerated treatment well    Behavior During Therapy WFL for tasks assessed/performed                Past Medical History:  Diagnosis Date   Cirrhosis (HCC)    Diabetes (HCC)    diagosed around 2010.    Hyperlipidemia    Hypothyroidism    Past Surgical History:  Procedure Laterality Date   ABDOMINAL HYSTERECTOMY     APPENDECTOMY     BUNIONECTOMY     TONSILLECTOMY     Patient Active Problem List   Diagnosis Date Noted   Solitary pulmonary nodule 11/19/2020   Hepatic cirrhosis (HCC) 05/14/2020   Abnormal liver ultrasound 12/19/2019   Abnormal LFTs 12/19/2019   Pancreatic cyst 12/19/2019    ONSET DATE: ~6 months  REFERRING DIAG: left hand weakness  THERAPY DIAG:  Other symptoms and signs involving the musculoskeletal system  Other lack of coordination  Rationale for Evaluation and Treatment: Rehabilitation  SUBJECTIVE:   SUBJECTIVE STATEMENT: S: "I was able to crochet a square."   PERTINENT HISTORY: Pt is a 77 y/o female presenting with left hand weakness for approximately 6 months. Pt has had an EMG which shows nerve impingement from C8-T1. Pt presents with muscle atrophy in the left hand, significant weakness compared to RUE.   PRECAUTIONS: None  WEIGHT BEARING RESTRICTIONS: No  PAIN:  Are you having pain? No  FALLS: Has patient fallen in last 6 months? No  PLOF:  Independent  PATIENT GOALS: To be able to do more with the left hand.   OBJECTIVE:   HAND DOMINANCE: Right  ADLs: Overall ADLs: Pt reports difficulty with crocheting, quilting, buttoning and zipping, opening and closing ziplock back. Tying shoes, putting on earrings, operating necklace clasps are difficult.   FUNCTIONAL OUTCOME MEASURES: Quick Dash: 31.82 01/12/23: 25  UPPER EXTREMITY ROM:      BUE A/ROM is WNL throughout.   UPPER EXTREMITY MMT:     MMT Left eval  Elbow flexion 5/5  Elbow extension 5/5  Wrist flexion 5/5  Wrist extension 5/5  Wrist ulnar deviation 5/5  Wrist radial deviation 5/5  Wrist pronation 5/5  Wrist supination 5/5  (Blank rows = not tested)  HAND FUNCTION: Grip strength: Right: 75 lbs; Left: 28 lbs, Lateral pinch: Right: 15 lbs, Left: 2 lbs, and 3 point pinch: Right: 8 lbs, Left: 3 lbs 01/12/23: Grip strength: Left: 35 lbs, Lateral pinch: Left: 3 lbs, and 3 point pinch: Left: 5 lbs  OBSERVATIONS: intrinsic atrophy   TODAY'S TREATMENT:  DATE:  01/12/23 -Pinch strengthening: pt using 3 point pinch, red and yellow clothespins to stack 4 towers of 5 sponges. Pt fatiguing with red clothespin after 8 sponges, switched to yellow clothespin and continued with 3 point pinch. Pt used red clothespin and lateral pinch to place one sponge back into bucket, then switched to yellow clothespin and lateral pinch for remainder of sponges.  -Coin manipulation: pt holding coins in palm, working on in-hand manipulation to translate to fingertips, pinch, and drop into slotted container. Min difficulty with manipulation today, only dropping 1 coin on 1st trial, none on 2nd trial, and none on 3rd trial.  -Grip strengthening: large beads gripper vertical at 29#, medium beads gripper vertical at 29# -Theraputty: red-flatten, using pvc pipe to cut circles  into putty, rolling, pinch-3 point and lateral, grip-pronated and supinated  01/06/23 -Provided crochet ring, assisted with technique -Pinch strengthening: pt using 3 point pinch, red and yellow clothespins to stack 4 towers of 4 sponges. Pt fatiguing with red clothespin after first tower, switched to yellow clothespin and lateral pinch, then used yellow and 3 point pinch. For final tower, pt attempting red clothespin, however was unable to complete and switched back to yellow and lateral pinch.  -Sponges: pt grasping high resistance sponges one at a time, holding 8 sponges at one time. Completed 3 rounds. -Grip strengthening: large beads gripper vertical at 25#, medium beads gripper vertical at 25# -Coin manipulation: pt holding coins in palm, working on in-hand manipulation to translate to fingertips, pinch, and drop into slotted container. Mod difficulty with manipulation. Dropping several coins during trials. Completed   12/30/22 -Wrist strengthening: 2lb dumbbell, flexion/extension, supination/pronation, ulnar/radial deviation, x10 -Gripping green weighted ball spelling ABC's in the air -Towel scrunches x6 -Wringing out towel x10 -Pinch Strengthening:  started with red resistance clip, pinching and stacking 6 cubes, tripod pinch x1, then lateral pinch x2 -Sponges: x18, x20 -Digiflex, 3lbs, full squeeze x10, each digit x6 -Grip Strengthening: 20# 10 beads  PATIENT EDUCATION: Education details: discussed continuing grip and pinch tasks at home, daily  Person educated: Patient Education method: Programmer, multimedia, Demonstration, and Handouts Education comprehension: verbalized understanding and returned demonstration  HOME EXERCISE PROGRAM: Eval: yellow theraputty hand strengthening 6/20: use clothespins to pick up and move cotton balls 6/25: Wrist strengthening and towel scrunches 7/2: manipulating coins, crochet ring 7/8: upgraded putty to red   GOALS: Goals reviewed with patient?  Yes  SHORT TERM GOALS: Target date: 01/14/23  Pt provided with and educated on HEP to improve hand strength required for functional use during ADLs.   Goal status: MET  2.  Pt will improve left hand strength by 10# and pinch strength by 3# to improve ability to grasp and hold utensils and household objects during ADLs.   Goal status: NOT MET  3.  Pt will be educated on AE available for use to improve ability to hold and manipulate small items during functional tasks.   Goal status: MET   ASSESSMENT:  CLINICAL IMPRESSION: Pt brought crochet square in to show OT. She was able to crochet without fatiguing, did not need the crochet ring. Pt reports she can tell that she is stronger than she was, as noted with tying shoe laces and shirt ties. Reassessment completed today, pt has met 2/3 goals and has improved both grip and pinch although did not meet the goal. Pt reporting improvement in daily task completion at home during ADLs. Continued with grip and pinch tasks today, pt able to do more  today before fatiguing and needing to reduce the resistance during pinch tasks. Educated pt on need to continue working on grip and pinch strength on a daily basis, continue using AE to improve ease of gripping or tedious tasks. Pt is agreeable to discharge today.   PERFORMANCE DEFICITS: in functional skills including ADLs, IADLs, coordination, strength, and UE functional use    PLAN:  OT FREQUENCY: 1x/week  OT DURATION: 4 weeks  PLANNED INTERVENTIONS: self care/ADL training, therapeutic exercise, therapeutic activity, and patient/family education  CONSULTED AND AGREED WITH PLAN OF CARE: Patient  PLAN FOR NEXT SESSION: N/A-discharge today    OCCUPATIONAL THERAPY DISCHARGE SUMMARY  Visits from Start of Care: 5  Current functional level related to goals / functional outcomes: See above. Pt has met 2/3 goals and reports improvement in her ability to pinch and grasp during functional tasks. She  has improved both her grip and pinch strength, but did not meet her strength goal.    Remaining deficits: Decreased grip and pinch strength in the left hand   Education / Equipment: HEP for grip/pinch strengthening, AE options   Patient agrees to discharge. Patient goals were partially met. Patient is being discharged due to meeting the stated rehab goals.Ezra Sites, OTR/L  [phone] 01/12/2023, 11:59 AM

## 2023-01-16 ENCOUNTER — Ambulatory Visit (HOSPITAL_COMMUNITY)
Admission: RE | Admit: 2023-01-16 | Discharge: 2023-01-16 | Disposition: A | Discharge status: Home / Self Care | Payer: Medicare Other | Source: Ambulatory Visit | Attending: Gastroenterology | Admitting: Gastroenterology

## 2023-01-16 ENCOUNTER — Encounter (HOSPITAL_COMMUNITY): Payer: Self-pay

## 2023-01-16 DIAGNOSIS — K862 Cyst of pancreas: Secondary | ICD-10-CM | POA: Insufficient documentation

## 2023-01-16 DIAGNOSIS — K7460 Unspecified cirrhosis of liver: Secondary | ICD-10-CM | POA: Insufficient documentation

## 2023-01-16 DIAGNOSIS — I700 Atherosclerosis of aorta: Secondary | ICD-10-CM | POA: Diagnosis not present

## 2023-01-16 DIAGNOSIS — R911 Solitary pulmonary nodule: Secondary | ICD-10-CM | POA: Insufficient documentation

## 2023-01-16 DIAGNOSIS — Z1289 Encounter for screening for malignant neoplasm of other sites: Secondary | ICD-10-CM | POA: Diagnosis not present

## 2023-03-31 DIAGNOSIS — R5383 Other fatigue: Secondary | ICD-10-CM | POA: Diagnosis not present

## 2023-03-31 DIAGNOSIS — M0609 Rheumatoid arthritis without rheumatoid factor, multiple sites: Secondary | ICD-10-CM | POA: Diagnosis not present

## 2023-03-31 DIAGNOSIS — M79642 Pain in left hand: Secondary | ICD-10-CM | POA: Diagnosis not present

## 2023-03-31 DIAGNOSIS — M2540 Effusion, unspecified joint: Secondary | ICD-10-CM | POA: Diagnosis not present

## 2023-03-31 DIAGNOSIS — M79641 Pain in right hand: Secondary | ICD-10-CM | POA: Diagnosis not present

## 2023-03-31 DIAGNOSIS — M2560 Stiffness of unspecified joint, not elsewhere classified: Secondary | ICD-10-CM | POA: Diagnosis not present

## 2023-05-12 DIAGNOSIS — M0609 Rheumatoid arthritis without rheumatoid factor, multiple sites: Secondary | ICD-10-CM | POA: Diagnosis not present

## 2023-06-03 ENCOUNTER — Encounter: Payer: Self-pay | Admitting: Gastroenterology

## 2023-06-19 DIAGNOSIS — E1129 Type 2 diabetes mellitus with other diabetic kidney complication: Secondary | ICD-10-CM | POA: Diagnosis not present

## 2023-06-19 DIAGNOSIS — I10 Essential (primary) hypertension: Secondary | ICD-10-CM | POA: Diagnosis not present

## 2023-06-19 DIAGNOSIS — R531 Weakness: Secondary | ICD-10-CM | POA: Diagnosis not present

## 2023-06-19 DIAGNOSIS — M0519 Rheumatoid lung disease with rheumatoid arthritis of multiple sites: Secondary | ICD-10-CM | POA: Diagnosis not present

## 2023-06-19 DIAGNOSIS — E782 Mixed hyperlipidemia: Secondary | ICD-10-CM | POA: Diagnosis not present

## 2023-06-23 DIAGNOSIS — M79641 Pain in right hand: Secondary | ICD-10-CM | POA: Diagnosis not present

## 2023-06-23 DIAGNOSIS — M2540 Effusion, unspecified joint: Secondary | ICD-10-CM | POA: Diagnosis not present

## 2023-06-23 DIAGNOSIS — R5383 Other fatigue: Secondary | ICD-10-CM | POA: Diagnosis not present

## 2023-06-23 DIAGNOSIS — M2560 Stiffness of unspecified joint, not elsewhere classified: Secondary | ICD-10-CM | POA: Diagnosis not present

## 2023-06-23 DIAGNOSIS — M79642 Pain in left hand: Secondary | ICD-10-CM | POA: Diagnosis not present

## 2023-06-23 DIAGNOSIS — M0609 Rheumatoid arthritis without rheumatoid factor, multiple sites: Secondary | ICD-10-CM | POA: Diagnosis not present

## 2023-07-13 NOTE — Progress Notes (Signed)
 GI Office Note    Referring Provider: Leonce Lucie PARAS, PA* Primary Care Physician:  Leonce Lucie PARAS DEVONNA  Primary Gastroenterologist: Ozell Hollingshead, MD   Chief Complaint   Chief Complaint  Patient presents with   Follow-up    Doing well, no issues    History of Present Illness   Jaime Matthews is a 78 y.o. female presenting today for follow up. Last seen in 01/2023. MELD 3.0 of 8 in 01/2023.   She has a history of abnormal LFTs with previous ultrasound showing somewhat nodular and coarse liver concerning for cirrhosis. Also with cyst in the body of the pancreas measuring 7 x 4 mm on ultrasound. Previous work-up showed no evidence of autoimmune hepatitis/PBC. She is immune to hepatitis A but not to hepatitis B. Vaccinations previously recommended. Ferritin previously elevated in the 400 range with normal iron saturations, improved to 265 on repeat. HFE genetic markers negative. 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    CT abdomen with and without contrast June 2023: Cirrhotic liver, no suspicious liver lesions.  Small cystic lesion of the body of the pancreas measuring 7 mm, unchanged in size compared to 2021 study.  Spleen normal in size.  MRI/MRCP or pancreatic protocol CT recommended in 2 years for surveillance of pancreatic lesion.  She also had mild wall thickening and fat stranding of the right colon, likely due to portal colopathy, colitis or other lesion cannot be excluded.  Colonoscopy was offered but declined.  Labs for celiac, autoimmune liver processes were negative.    CT chest for follow up pulmonary nodule completed in 01/2023. Due for pancreatic imaging in 12/2023. She is willing to have MRI as she recently tolerated large bore MRI at [facility]. We will switch her recall to MRI 12/2023. Due for labs and RUQ U/S for hepatoma screening.   Today: No abdominal pain. Some increased issues with constipation. Colace not working as well. BM every other day. No  heartburn. No n/v. No melena, brbpr. No swelling.      Chest CT 01/2023: IMPRESSION: 1. 9 mm right lower lobe nodule is unchanged from 06/07/2020 and is considered benign. Per Fleischner Society guidelines, no follow-up is necessary. 2. Cirrhosis. 3. Aortic atherosclerosis (ICD10-I70.0). Coronary artery calcification.  RUQ U/S 01/2023: IMPRESSION: 1. Cirrhosis. No focal liver lesion identified. 2. Question sludge in the gallbladder. Previously noted gallstone is not as well visualized on today's exam. No evidence of acute cholecystitis.  Plan for MRI/MRCP 12/2023, large bore.   No known EGD/colonoscopy.  Medications   Current Outpatient Medications  Medication Sig Dispense Refill   aspirin EC 81 MG tablet Take 81 mg by mouth daily. Swallow whole.     atorvastatin (LIPITOR) 10 MG tablet Take 1 tablet by mouth daily.     docusate sodium (COLACE) 100 MG capsule Take 200 mg by mouth daily.     Hydroxychloroquine Sulfate 300 MG TABS Take 1 tablet Orally once a day for 90 days     levothyroxine (SYNTHROID) 50 MCG tablet Take 1 tablet by mouth daily.     lisinopril (ZESTRIL) 2.5 MG tablet Take 1 tablet by mouth daily.     Multiple Vitamins-Minerals (CENTRUM SILVER 50+WOMEN) TABS Take 1 tablet by mouth daily.     Omega-3 Fatty Acids (FISH OIL) 1200 MG CAPS Take 2 capsules by mouth daily.     ONETOUCH ULTRA test strip USE TO TEST BLOODSUGAR TWICE DAILY     TRULICITY 3 MG/0.5ML SOAJ INJECT 3MG   ONCE A WEEK     No current facility-administered medications for this visit.    Allergies   Allergies as of 07/14/2023   (No Known Allergies)          Review of Systems   General: Negative for anorexia, weight loss, fever, chills, fatigue, weakness. ENT: Negative for hoarseness, difficulty swallowing , nasal congestion. CV: Negative for chest pain, angina, palpitations, dyspnea on exertion, peripheral edema.  Respiratory: Negative for dyspnea at rest, dyspnea on exertion, cough, sputum,  wheezing.  GI: See history of present illness. GU:  Negative for dysuria, hematuria, urinary incontinence, urinary frequency, nocturnal urination.  Endo: Negative for unusual weight change.     Physical Exam   BP 132/76 (BP Location: Right Arm, Patient Position: Sitting, Cuff Size: Normal)   Pulse 76   Temp 98.3 F (36.8 C) (Oral)   Ht 5' 5 (1.651 m)   Wt 166 lb 3.2 oz (75.4 kg)   SpO2 98%   BMI 27.66 kg/m    General: Well-nourished, well-developed in no acute distress.  Eyes: No icterus. Mouth: Oropharyngeal mucosa moist and pink   Lungs: Clear to auscultation bilaterally.  Heart: Regular rate and rhythm, no murmurs rubs or gallops.  Abdomen: Bowel sounds are normal, nontender, nondistended, no hepatosplenomegaly or masses,  no abdominal bruits or hernia , no rebound or guarding.  Rectal: not performed  Extremities: No lower extremity edema. No clubbing or deformities. Neuro: Alert and oriented x 4   Skin: Warm and dry, no jaundice.   Psych: Alert and cooperative, normal mood and affect.  Labs   Lab Results  Component Value Date   NA 143 01/06/2023   CL 108 (H) 01/06/2023   K 4.6 01/06/2023   CO2 22 01/06/2023   BUN 17 01/06/2023   CREATININE 0.95 01/06/2023   EGFR 62 01/06/2023   CALCIUM 9.7 01/06/2023   ALBUMIN 4.2 01/06/2023   GLUCOSE 92 01/06/2023   Lab Results  Component Value Date   ALT 56 (H) 01/06/2023   AST 59 (H) 01/06/2023   ALKPHOS 93 01/06/2023   BILITOT 0.8 01/06/2023    Lab Results  Component Value Date   WBC 6.8 01/06/2023   HGB 12.1 01/06/2023   HCT 35.7 01/06/2023   MCV 95 01/06/2023   PLT 145 (L) 01/06/2023   Lab Results  Component Value Date   INR 1.1 01/06/2023   INR 1.1 06/06/2022   INR 1.1 11/28/2021   Lab Results  Component Value Date   IRON 94 01/06/2023   TIBC 310 01/06/2023   FERRITIN 486 (H) 01/06/2023   Lab Results  Component Value Date   VITAMINB12 1,184 (H) 09/26/2022   Lab Results  Component Value Date    HGBA1C 7.1 (H) 09/26/2022    Imaging Studies   No results found.  Assessment/Plan:   Cirrhosis: has been well compensated. Mild thrombocytopenia. No prior EGD to my knowledge. No evidence of portal hypertension on prior CT imaging in 2023.  -update labs and RUQ U/S.   Elevated AST/ALT: extensive serologies as previously outlined. No evidence of autoimmune hepatitis. Update labs. Per guidelines will follow up on ferritin, iron, alpha 1 antitrypsin, ceruloplasmin although 1 antitrypsin deficiency and Wheatley's disease very unlikely given her age. May be secondary to [facility], or medication related.   Pancreatic cysts: due imaging in 12/2023. Plan for MRI large bore    Sonny RAMAN. Ezzard, MHS, PA-C [facility]

## 2023-07-14 ENCOUNTER — Encounter: Payer: Self-pay | Admitting: Gastroenterology

## 2023-07-14 ENCOUNTER — Telehealth: Payer: Self-pay | Admitting: *Deleted

## 2023-07-14 ENCOUNTER — Ambulatory Visit: Payer: Medicare Other | Admitting: Gastroenterology

## 2023-07-14 VITALS — BP 132/76 | HR 76 | Temp 98.3°F | Ht 65.0 in | Wt 166.2 lb

## 2023-07-14 DIAGNOSIS — R7401 Elevation of levels of liver transaminase levels: Secondary | ICD-10-CM

## 2023-07-14 DIAGNOSIS — R7989 Other specified abnormal findings of blood chemistry: Secondary | ICD-10-CM

## 2023-07-14 DIAGNOSIS — K862 Cyst of pancreas: Secondary | ICD-10-CM

## 2023-07-14 DIAGNOSIS — D696 Thrombocytopenia, unspecified: Secondary | ICD-10-CM

## 2023-07-14 DIAGNOSIS — K7460 Unspecified cirrhosis of liver: Secondary | ICD-10-CM

## 2023-07-14 DIAGNOSIS — K5900 Constipation, unspecified: Secondary | ICD-10-CM | POA: Insufficient documentation

## 2023-07-14 NOTE — Telephone Encounter (Signed)
 LMOVM to call back to give [place] details 1/13, arrival 8:15am, npo midnight

## 2023-07-14 NOTE — Patient Instructions (Signed)
 Complete labs and ultrasound. You will be due MRI of your pancreas in June 2025.  Try Miralax two capfuls daily until soft stool, then continue one capful daily as needed. Return office visit in six months.

## 2023-07-15 ENCOUNTER — Other Ambulatory Visit (HOSPITAL_COMMUNITY): Payer: Self-pay | Admitting: Family Medicine

## 2023-07-15 DIAGNOSIS — Z1231 Encounter for screening mammogram for malignant neoplasm of breast: Secondary | ICD-10-CM

## 2023-07-15 NOTE — Telephone Encounter (Signed)
 Pt says she is aware of [place] appt that she seen this on her mychart.

## 2023-07-16 DIAGNOSIS — K5900 Constipation, unspecified: Secondary | ICD-10-CM | POA: Diagnosis not present

## 2023-07-16 DIAGNOSIS — R7989 Other specified abnormal findings of blood chemistry: Secondary | ICD-10-CM | POA: Diagnosis not present

## 2023-07-16 DIAGNOSIS — K862 Cyst of pancreas: Secondary | ICD-10-CM | POA: Diagnosis not present

## 2023-07-16 DIAGNOSIS — K7460 Unspecified cirrhosis of liver: Secondary | ICD-10-CM | POA: Diagnosis not present

## 2023-07-17 LAB — COMPREHENSIVE METABOLIC PANEL
ALT: 34 [IU]/L — ABNORMAL HIGH (ref 0–32)
AST: 34 [IU]/L (ref 0–40)
Albumin: 3.8 g/dL (ref 3.8–4.8)
Alkaline Phosphatase: 93 [IU]/L (ref 44–121)
BUN/Creatinine Ratio: 20 (ref 12–28)
BUN: 20 mg/dL (ref 8–27)
Bilirubin Total: 0.5 mg/dL (ref 0.0–1.2)
CO2: 23 mmol/L (ref 20–29)
Calcium: 9.2 mg/dL (ref 8.7–10.3)
Chloride: 105 mmol/L (ref 96–106)
Creatinine, Ser: 1.02 mg/dL — ABNORMAL HIGH (ref 0.57–1.00)
Globulin, Total: 2.9 g/dL (ref 1.5–4.5)
Glucose: 124 mg/dL — ABNORMAL HIGH (ref 70–99)
Potassium: 4.4 mmol/L (ref 3.5–5.2)
Sodium: 144 mmol/L (ref 134–144)
Total Protein: 6.7 g/dL (ref 6.0–8.5)
eGFR: 57 mL/min/{1.73_m2} — ABNORMAL LOW (ref >59)

## 2023-07-17 LAB — ALPHA-1-ANTITRYPSIN: A-1 Antitrypsin: 173 mg/dL (ref 101–187)

## 2023-07-17 LAB — CBC WITH DIFFERENTIAL/PLATELET
Basophils Absolute: 0.1 10*3/uL (ref 0.0–0.2)
Basos: 1 %
EOS (ABSOLUTE): 0.2 10*3/uL (ref 0.0–0.4)
Eos: 3 %
Hematocrit: 34.7 % (ref 34.0–46.6)
Hemoglobin: 11.6 g/dL (ref 11.1–15.9)
Immature Grans (Abs): 0 10*3/uL (ref 0.0–0.1)
Immature Granulocytes: 0 %
Lymphocytes Absolute: 1.2 10*3/uL (ref 0.7–3.1)
Lymphs: 17 %
MCH: 32.5 pg (ref 26.6–33.0)
MCHC: 33.4 g/dL (ref 31.5–35.7)
MCV: 97 fL (ref 79–97)
Monocytes Absolute: 0.7 10*3/uL (ref 0.1–0.9)
Monocytes: 11 %
Neutrophils Absolute: 4.7 10*3/uL (ref 1.4–7.0)
Neutrophils: 68 %
Platelets: 148 10*3/uL — ABNORMAL LOW (ref 150–450)
RBC: 3.57 x10E6/uL — ABNORMAL LOW (ref 3.77–5.28)
RDW: 12.4 % (ref 11.7–15.4)
WBC: 6.8 10*3/uL (ref 3.4–10.8)

## 2023-07-17 LAB — IRON,TIBC AND FERRITIN PANEL
Ferritin: 386 ng/mL — ABNORMAL HIGH (ref 15–150)
Iron Saturation: 31 % (ref 15–55)
Iron: 82 ug/dL (ref 27–139)
Total Iron Binding Capacity: 263 ug/dL (ref 250–450)
UIBC: 181 ug/dL (ref 118–369)

## 2023-07-17 LAB — CERULOPLASMIN: Ceruloplasmin: 30.9 mg/dL (ref 19.0–39.0)

## 2023-07-17 LAB — PROTIME-INR
INR: 1.1 (ref 0.9–1.2)
Prothrombin Time: 12.4 s — ABNORMAL HIGH (ref 9.1–12.0)

## 2023-07-17 LAB — AFP TUMOR MARKER: AFP, Serum, Tumor Marker: 4.6 ng/mL (ref 0.0–9.2)

## 2023-07-20 ENCOUNTER — Ambulatory Visit (HOSPITAL_COMMUNITY): Payer: Medicare Other

## 2023-07-28 ENCOUNTER — Ambulatory Visit (HOSPITAL_COMMUNITY)
Admission: RE | Admit: 2023-07-28 | Discharge: 2023-07-28 | Disposition: A | Discharge status: Home / Self Care | Payer: Medicare Other | Source: Ambulatory Visit | Attending: Gastroenterology | Admitting: Gastroenterology

## 2023-07-28 DIAGNOSIS — K7689 Other specified diseases of liver: Secondary | ICD-10-CM | POA: Diagnosis not present

## 2023-07-28 DIAGNOSIS — K862 Cyst of pancreas: Secondary | ICD-10-CM | POA: Diagnosis not present

## 2023-07-28 DIAGNOSIS — K5900 Constipation, unspecified: Secondary | ICD-10-CM | POA: Diagnosis not present

## 2023-07-28 DIAGNOSIS — R7989 Other specified abnormal findings of blood chemistry: Secondary | ICD-10-CM | POA: Diagnosis not present

## 2023-07-28 DIAGNOSIS — K7460 Unspecified cirrhosis of liver: Secondary | ICD-10-CM | POA: Diagnosis not present

## 2023-07-30 ENCOUNTER — Inpatient Hospital Stay (HOSPITAL_COMMUNITY): Admission: RE | Admit: 2023-07-30 | Payer: Medicare Other | Source: Ambulatory Visit

## 2023-07-30 ENCOUNTER — Ambulatory Visit (HOSPITAL_COMMUNITY)
Admission: RE | Admit: 2023-07-30 | Discharge: 2023-07-30 | Disposition: A | Discharge status: Home / Self Care | Payer: Medicare Other | Source: Ambulatory Visit | Attending: Family Medicine | Admitting: Family Medicine

## 2023-07-30 ENCOUNTER — Encounter (HOSPITAL_COMMUNITY): Payer: Self-pay

## 2023-07-30 DIAGNOSIS — Z1231 Encounter for screening mammogram for malignant neoplasm of breast: Secondary | ICD-10-CM | POA: Insufficient documentation

## 2023-08-05 ENCOUNTER — Other Ambulatory Visit (HOSPITAL_COMMUNITY): Payer: Self-pay | Admitting: Family Medicine

## 2023-08-05 DIAGNOSIS — R928 Other abnormal and inconclusive findings on diagnostic imaging of breast: Secondary | ICD-10-CM

## 2023-08-27 ENCOUNTER — Encounter (HOSPITAL_COMMUNITY): Payer: Self-pay

## 2023-08-27 ENCOUNTER — Encounter (HOSPITAL_COMMUNITY): Payer: Medicare Other

## 2023-08-27 ENCOUNTER — Ambulatory Visit (HOSPITAL_COMMUNITY): Payer: Medicare Other

## 2023-09-15 ENCOUNTER — Other Ambulatory Visit (HOSPITAL_COMMUNITY): Payer: Medicare Other

## 2023-09-15 ENCOUNTER — Encounter (HOSPITAL_COMMUNITY): Payer: Medicare Other

## 2023-09-17 ENCOUNTER — Ambulatory Visit (HOSPITAL_COMMUNITY)
Admission: RE | Admit: 2023-09-17 | Discharge: 2023-09-17 | Disposition: A | Discharge status: Home / Self Care | Payer: Medicare Other | Source: Ambulatory Visit | Attending: Family Medicine | Admitting: Family Medicine

## 2023-09-17 DIAGNOSIS — R92333 Mammographic heterogeneous density, bilateral breasts: Secondary | ICD-10-CM | POA: Diagnosis not present

## 2023-09-17 DIAGNOSIS — R928 Other abnormal and inconclusive findings on diagnostic imaging of breast: Secondary | ICD-10-CM | POA: Insufficient documentation

## 2023-09-17 DIAGNOSIS — N6002 Solitary cyst of left breast: Secondary | ICD-10-CM | POA: Diagnosis not present

## 2023-09-17 DIAGNOSIS — N6321 Unspecified lump in the left breast, upper outer quadrant: Secondary | ICD-10-CM | POA: Diagnosis not present

## 2023-09-22 DIAGNOSIS — R5383 Other fatigue: Secondary | ICD-10-CM | POA: Diagnosis not present

## 2023-09-22 DIAGNOSIS — M0609 Rheumatoid arthritis without rheumatoid factor, multiple sites: Secondary | ICD-10-CM | POA: Diagnosis not present

## 2023-09-22 DIAGNOSIS — M79641 Pain in right hand: Secondary | ICD-10-CM | POA: Diagnosis not present

## 2023-09-22 DIAGNOSIS — M79642 Pain in left hand: Secondary | ICD-10-CM | POA: Diagnosis not present

## 2023-09-22 DIAGNOSIS — M2560 Stiffness of unspecified joint, not elsewhere classified: Secondary | ICD-10-CM | POA: Diagnosis not present

## 2023-09-22 DIAGNOSIS — M2540 Effusion, unspecified joint: Secondary | ICD-10-CM | POA: Diagnosis not present

## 2023-10-06 ENCOUNTER — Other Ambulatory Visit (HOSPITAL_COMMUNITY): Payer: Medicare Other

## 2023-10-06 ENCOUNTER — Encounter (HOSPITAL_COMMUNITY): Payer: Medicare Other

## 2023-11-16 DIAGNOSIS — H2513 Age-related nuclear cataract, bilateral: Secondary | ICD-10-CM | POA: Diagnosis not present

## 2023-11-16 DIAGNOSIS — H52203 Unspecified astigmatism, bilateral: Secondary | ICD-10-CM | POA: Diagnosis not present

## 2023-11-16 DIAGNOSIS — E113292 Type 2 diabetes mellitus with mild nonproliferative diabetic retinopathy without macular edema, left eye: Secondary | ICD-10-CM | POA: Diagnosis not present

## 2023-11-16 DIAGNOSIS — Z79899 Other long term (current) drug therapy: Secondary | ICD-10-CM | POA: Diagnosis not present

## 2023-11-16 DIAGNOSIS — H524 Presbyopia: Secondary | ICD-10-CM | POA: Diagnosis not present

## 2023-11-24 DIAGNOSIS — H527 Unspecified disorder of refraction: Secondary | ICD-10-CM | POA: Diagnosis not present

## 2023-12-03 ENCOUNTER — Encounter: Payer: Self-pay | Admitting: Internal Medicine

## 2023-12-18 ENCOUNTER — Telehealth: Payer: Self-pay | Admitting: *Deleted

## 2023-12-18 DIAGNOSIS — K862 Cyst of pancreas: Secondary | ICD-10-CM

## 2023-12-18 NOTE — Telephone Encounter (Signed)
Pt aware of MRI appt

## 2023-12-18 NOTE — Telephone Encounter (Signed)
 Pt called in as she received recall letter for MRI. She wants early July tues/Thursday  Scheduled for 7/1, arrival 7:15am, npo 4 hrs prior  Called pt back and LMTCB

## 2023-12-31 DIAGNOSIS — E7849 Other hyperlipidemia: Secondary | ICD-10-CM | POA: Diagnosis not present

## 2023-12-31 DIAGNOSIS — I10 Essential (primary) hypertension: Secondary | ICD-10-CM | POA: Diagnosis not present

## 2023-12-31 DIAGNOSIS — E1129 Type 2 diabetes mellitus with other diabetic kidney complication: Secondary | ICD-10-CM | POA: Diagnosis not present

## 2023-12-31 DIAGNOSIS — E782 Mixed hyperlipidemia: Secondary | ICD-10-CM | POA: Diagnosis not present

## 2024-01-04 ENCOUNTER — Other Ambulatory Visit: Payer: Self-pay | Admitting: Gastroenterology

## 2024-01-04 ENCOUNTER — Ambulatory Visit (HOSPITAL_COMMUNITY)
Admission: RE | Admit: 2024-01-04 | Discharge: 2024-01-04 | Disposition: A | Discharge status: Home / Self Care | Source: Ambulatory Visit | Attending: Gastroenterology | Admitting: Gastroenterology

## 2024-01-04 DIAGNOSIS — K766 Portal hypertension: Secondary | ICD-10-CM | POA: Diagnosis not present

## 2024-01-04 DIAGNOSIS — K862 Cyst of pancreas: Secondary | ICD-10-CM | POA: Diagnosis not present

## 2024-01-04 DIAGNOSIS — R188 Other ascites: Secondary | ICD-10-CM | POA: Diagnosis not present

## 2024-01-04 DIAGNOSIS — R109 Unspecified abdominal pain: Secondary | ICD-10-CM | POA: Diagnosis not present

## 2024-01-04 DIAGNOSIS — K7469 Other cirrhosis of liver: Secondary | ICD-10-CM | POA: Diagnosis not present

## 2024-01-04 MED ORDER — GADOBUTROL 1 MMOL/ML IV SOLN
7.5000 mL | Freq: Once | INTRAVENOUS | Status: AC | PRN
  Administered 2024-01-04: 7.5 mL via INTRAVENOUS

## 2024-01-05 ENCOUNTER — Ambulatory Visit (HOSPITAL_COMMUNITY): Admission: RE | Admit: 2024-01-05 | Source: Ambulatory Visit

## 2024-01-22 ENCOUNTER — Ambulatory Visit: Payer: Self-pay | Admitting: Gastroenterology

## 2024-01-22 DIAGNOSIS — R7989 Other specified abnormal findings of blood chemistry: Secondary | ICD-10-CM

## 2024-01-22 DIAGNOSIS — K7460 Unspecified cirrhosis of liver: Secondary | ICD-10-CM

## 2024-01-25 NOTE — Progress Notes (Signed)
 I have scheduled pt for a follow up appointment.  Waiting for her to call back to confirm.

## 2024-02-22 DIAGNOSIS — K7460 Unspecified cirrhosis of liver: Secondary | ICD-10-CM | POA: Diagnosis not present

## 2024-02-22 DIAGNOSIS — R7989 Other specified abnormal findings of blood chemistry: Secondary | ICD-10-CM | POA: Diagnosis not present

## 2024-02-23 LAB — COMPREHENSIVE METABOLIC PANEL WITH GFR
ALT: 47 IU/L — ABNORMAL HIGH (ref 0–32)
AST: 46 IU/L — ABNORMAL HIGH (ref 0–40)
Albumin: 3.9 g/dL (ref 3.8–4.8)
Alkaline Phosphatase: 81 IU/L (ref 44–121)
BUN/Creatinine Ratio: 16 (ref 12–28)
BUN: 16 mg/dL (ref 8–27)
Bilirubin Total: 0.9 mg/dL (ref 0.0–1.2)
CO2: 20 mmol/L (ref 20–29)
Calcium: 9.6 mg/dL (ref 8.7–10.3)
Chloride: 108 mmol/L — ABNORMAL HIGH (ref 96–106)
Creatinine, Ser: 1.03 mg/dL — ABNORMAL HIGH (ref 0.57–1.00)
Globulin, Total: 2.5 g/dL (ref 1.5–4.5)
Glucose: 91 mg/dL (ref 70–99)
Potassium: 4.5 mmol/L (ref 3.5–5.2)
Sodium: 143 mmol/L (ref 134–144)
Total Protein: 6.4 g/dL (ref 6.0–8.5)
eGFR: 56 mL/min/1.73 — ABNORMAL LOW (ref >59)

## 2024-02-23 LAB — PROTIME-INR
INR: 1.1 (ref 0.9–1.2)
Prothrombin Time: 11.9 s (ref 9.1–12.0)

## 2024-02-23 LAB — CBC WITH DIFFERENTIAL/PLATELET
Basophils Absolute: 0.1 x10E3/uL (ref 0.0–0.2)
Basos: 1 %
EOS (ABSOLUTE): 0.2 x10E3/uL (ref 0.0–0.4)
Eos: 4 %
Hematocrit: 35.4 % (ref 34.0–46.6)
Hemoglobin: 11.6 g/dL (ref 11.1–15.9)
Immature Grans (Abs): 0 x10E3/uL (ref 0.0–0.1)
Immature Granulocytes: 0 %
Lymphocytes Absolute: 1.1 x10E3/uL (ref 0.7–3.1)
Lymphs: 18 %
MCH: 32.6 pg (ref 26.6–33.0)
MCHC: 32.8 g/dL (ref 31.5–35.7)
MCV: 99 fL — ABNORMAL HIGH (ref 79–97)
Monocytes Absolute: 0.8 x10E3/uL (ref 0.1–0.9)
Monocytes: 13 %
Neutrophils Absolute: 3.8 x10E3/uL (ref 1.4–7.0)
Neutrophils: 64 %
Platelets: 105 x10E3/uL — ABNORMAL LOW (ref 150–450)
RBC: 3.56 x10E6/uL — ABNORMAL LOW (ref 3.77–5.28)
RDW: 12.2 % (ref 11.7–15.4)
WBC: 6 x10E3/uL (ref 3.4–10.8)

## 2024-02-23 LAB — IRON,TIBC AND FERRITIN PANEL
Ferritin: 276 ng/mL — ABNORMAL HIGH (ref 15–150)
Iron Saturation: 33 % (ref 15–55)
Iron: 94 ug/dL (ref 27–139)
Total Iron Binding Capacity: 283 ug/dL (ref 250–450)
UIBC: 189 ug/dL (ref 118–369)

## 2024-02-23 LAB — AFP TUMOR MARKER: AFP, Serum, Tumor Marker: 4 ng/mL (ref 0.0–9.2)

## 2024-02-24 ENCOUNTER — Ambulatory Visit: Admitting: Gastroenterology

## 2024-02-24 ENCOUNTER — Encounter: Payer: Self-pay | Admitting: Gastroenterology

## 2024-02-24 VITALS — BP 118/66 | HR 69 | Temp 98.0°F | Ht 65.0 in | Wt 169.2 lb

## 2024-02-24 DIAGNOSIS — K862 Cyst of pancreas: Secondary | ICD-10-CM | POA: Diagnosis not present

## 2024-02-24 DIAGNOSIS — R7989 Other specified abnormal findings of blood chemistry: Secondary | ICD-10-CM | POA: Diagnosis not present

## 2024-02-24 DIAGNOSIS — D696 Thrombocytopenia, unspecified: Secondary | ICD-10-CM | POA: Diagnosis not present

## 2024-02-24 DIAGNOSIS — K7460 Unspecified cirrhosis of liver: Secondary | ICD-10-CM

## 2024-02-24 DIAGNOSIS — R7401 Elevation of levels of liver transaminase levels: Secondary | ICD-10-CM | POA: Diagnosis not present

## 2024-02-24 NOTE — Patient Instructions (Addendum)
  Please repeat kidney labs NONFASTING when you suspect you are well hydrated.   You will be due for liver labs in 08/2023.   You will be due for liver imaging in 06/2024.   Return office visit in six months. Call if you have any questions or concerns.    Consider having Hepatitis B vaccinations through local pharmacy or you can discuss with your PCP.

## 2024-02-24 NOTE — Progress Notes (Signed)
 GI Office Note    Referring Provider: Leonce Lucie PARAS, PA* Primary Care Physician:  Leonce Lucie PARAS DEVONNA  Primary Gastroenterologist:Michael Shaaron, MD   Chief Complaint   Chief Complaint  Patient presents with   Follow-up    Doing well, no issues    History of Present Illness   Jaime Matthews is a 78 y.o. female presenting today for follow up. She has a history of elevated LFTs and cirrhosis. Also with cyst in the body of the pancreas measuring 7 x 4 mm on ultrasound. Previous work-up showed no evidence of autoimmune hepatitis/PBC. She is immune to hepatitis A but not to hepatitis B. Vaccinations previously recommended. Ferritin previously elevated in the 400 range with normal iron saturations, improved to 265 on repeat. HFE genetic markers negative. 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   Current MELD 3.0 of 9, previously was 8.   Discussed the use of AI scribe software for clinical note transcription with the patient, who gave verbal consent to proceed.    She experiences significant fatigue, which she attributes to her rheumatoid arthritis. Developed RA after receiving covid vaccines. She describes having 'no energy' and notes that this chronic fatigue is consistent with her condition. Her appetite remains good, and she is not actively trying to lose weight, though she wants her appetite to decrease. Her Trulicity was recently increased. Her weight has been stable in the 160s.  She no longer takes Colace, as increased water intake has helped regulate her bowel movements. No melena, brbpr. No abdominal pain. No heartburn, vomiting.   Her recent blood work includes a hemoglobin level of 11.6 which is stable from 07/2023. Her platelets of 105,000, down from 148,000 in January, chronic mild thrombocytopenia. She has not experienced any bleeding issues. Her creatinine levels have risen from 0.95 a year ago to 1.01 in January and 1.03 recently. She always fasts with labs. She is  concerned about this due to her husband's history of dialysis. Her liver enzymes, AST and ALT, are slightly elevated but stable, with values of 46 and 47, respectively.  Her ferritin levels have decreased from 486 a year ago to 276, which may be related to her rheumatoid arthritis. 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  She has had remote colonoscopy and not interested in another one. She had partial Hep B vaccination in school but never completed series.      Prior Data   MRI abd/MRCP with and without contrast 12/2023: IMPRESSION: 1. Unchanged 0.7 cm cystic lesion in the ventral pancreatic body, consistent with a small side branch IPMN or pseudocyst. Given small size, well established long-term imaging stability, and patient age, this is benign and no further follow-up or characterization is required. 2. Cirrhosis and portal hypertension. No suspicious liver lesion. 3. Trace ascites.  Labs 07/2023: ceruloplasmin 30.9, A-1A level 173, iron 82, tibc 263, iron sat 31, ferritin 386, MELD 3.0 of 8.  Chest CT 01/2023: IMPRESSION: 1. 9 mm right lower lobe nodule is unchanged from 06/07/2020 and is considered benign. Per Fleischner Society guidelines, no follow-up is necessary. 2. Cirrhosis. 3. Aortic atherosclerosis (ICD10-I70.0). Coronary artery calcification.   RUQ U/S 01/2023: IMPRESSION: 1. Cirrhosis. No focal liver lesion identified. 2. Question sludge in the gallbladder. Previously noted gallstone is not as well visualized on today's exam. No evidence of acute cholecystitis.    CT abdomen with and without contrast June 2023: Cirrhotic liver, no suspicious liver lesions.  Small cystic lesion of the body of the pancreas measuring 7 mm, unchanged in size compared to 2021 study.  Spleen normal in size.  MRI/MRCP or pancreatic protocol CT recommended in 2 years for surveillance of pancreatic lesion.  She also had mild wall thickening and fat stranding of  the right colon, likely due to portal colopathy, colitis or other lesion cannot be excluded.  Colonoscopy was offered but declined.  Labs for celiac, autoimmune liver processes were negative.     Remote colonoscopy. Patient not interested in future colonoscopy.  Medications   Current Outpatient Medications  Medication Sig Dispense Refill   Accu-Chek Softclix Lancets lancets daily.     aspirin EC 81 MG tablet Take 81 mg by mouth daily. Swallow whole.     atorvastatin (LIPITOR) 10 MG tablet Take 1 tablet by mouth daily.     hydroxychloroquine (PLAQUENIL) 200 MG tablet Take 300 mg by mouth daily.     levothyroxine (SYNTHROID) 50 MCG tablet Take 1 tablet by mouth daily.     lisinopril (ZESTRIL) 2.5 MG tablet Take 1 tablet by mouth daily.     Multiple Vitamins-Minerals (CENTRUM SILVER 50+WOMEN) TABS Take 1 tablet by mouth daily.     Omega-3 Fatty Acids (FISH OIL) 1200 MG CAPS Take 2 capsules by mouth daily.     TRULICITY 4.5 MG/0.5ML SOAJ      No current facility-administered medications for this visit.    Allergies   Allergies as of 02/24/2024   (No Known Allergies)       Review of Systems   General: Negative for anorexia, weight loss, fever, chills, +fatigue, weakness. ENT: Negative for hoarseness, difficulty swallowing , nasal congestion. CV: Negative for chest pain, angina, palpitations, dyspnea on exertion, peripheral edema.  Respiratory: Negative for dyspnea at rest, dyspnea on exertion, cough, sputum, wheezing.  GI: See history of present illness. GU:  Negative for dysuria, hematuria, urinary incontinence, urinary frequency, nocturnal urination.  Endo: Negative for unusual weight change.     Physical Exam   BP 118/66 (BP Location: Right Arm, Patient Position: Sitting, Cuff Size: Normal)   Pulse 69   Temp 98 F (36.7 C) (Oral)   Ht 5' 5 (1.651 m)   Wt 169 lb 3.2 oz (76.7 kg)   SpO2 100%   BMI 28.16 kg/m    General: Well-nourished, well-developed in no acute  distress.  Eyes: No icterus. Mouth: Oropharyngeal mucosa moist and pink   Lungs: Clear to auscultation bilaterally.  Heart: Regular rate and rhythm, no murmurs rubs or gallops.  Abdomen: Bowel sounds are normal, nontender, nondistended, no hepatosplenomegaly or masses,  no abdominal bruits or hernia , no rebound or guarding.  Rectal: not performed Extremities: No lower extremity edema. No clubbing or deformities. Neuro: Alert and oriented x 4   Skin: Warm and dry, no jaundice.   Psych: Alert and cooperative, normal mood and affect.  Labs   Lab Results  Component Value Date   IRON 94 02/22/2024   TIBC 283 02/22/2024   FERRITIN 276 (H) 02/22/2024   Lab Results  Component Value Date   NA 143 02/22/2024   CL 108 (H) 02/22/2024   K 4.5 02/22/2024   CO2 20 02/22/2024   BUN 16 02/22/2024   CREATININE 1.03 (H) 02/22/2024   EGFR 56 (L) 02/22/2024   CALCIUM 9.6 02/22/2024   ALBUMIN 3.9 02/22/2024   GLUCOSE 91 02/22/2024   Lab Results  Component Value Date   ALT 47 (H) 02/22/2024   AST 46 (  H) 02/22/2024   ALKPHOS 81 02/22/2024   BILITOT 0.9 02/22/2024   Lab Results  Component Value Date   WBC 6.0 02/22/2024   HGB 11.6 02/22/2024   HCT 35.4 02/22/2024   MCV 99 (H) 02/22/2024   PLT 105 (L) 02/22/2024   AFP 4 Imaging Studies   No results found.  Assessment/Plan:        Cirrhosis/elevated AST/ALT: Cirrhosis has been well compensated. She has mild thrombocytopenia previously, now with some drop in her platelet count. No prior EGD on file. No evidence of portal HTN on MRI or RUQ U/S. MELD 3.0 of 9, stable. Extensive serologies completed for chronically elevated AST/ALT as outlined above. No evidence of autoimmune hepatitis, alpha 1 antitrypsin def, [place], celiac, or iron overload. May be medication related.   -monitor labs, update again in six months -ruq u/s due in 06/2024 -consider hep b vaccinations at local pharmacy or with PCP -ov in six months  Pancreatic  Cyst Pancreatic cyst is unchanged on MRI.   - Schedule follow-up imaging for pancreatic cyst in two years with MRI Abd/MRCP with and without contrast  Elevated Creatinine: Mildly elevated creatinine at 1.03 mg/dL, slightly above normal range. Previous creatinine was 1.02 mg/dL in January and 9.04 mg/dL a year ago. Possible age-related decline in kidney function versus dehydration in setting of fasting labs. . She is concerned due to her husband's dialysis. Discussed potential recheck of labs when not fasting to assess kidney function more accurately. If persistent elevation, proceed with nephrology referral - Recheck kidney function labs when not fasting. - Consider nephrology referral if recheck remains abnormal.       Sonny RAMAN. Ezzard, MHS, PA-C [facility]

## 2024-03-01 DIAGNOSIS — R7989 Other specified abnormal findings of blood chemistry: Secondary | ICD-10-CM | POA: Diagnosis not present

## 2024-03-02 LAB — BASIC METABOLIC PANEL WITH GFR
BUN/Creatinine Ratio: 14 (ref 12–28)
BUN: 13 mg/dL (ref 8–27)
CO2: 20 mmol/L (ref 20–29)
Calcium: 9 mg/dL (ref 8.7–10.3)
Chloride: 106 mmol/L (ref 96–106)
Creatinine, Ser: 0.92 mg/dL (ref 0.57–1.00)
Glucose: 148 mg/dL — ABNORMAL HIGH (ref 70–99)
Potassium: 4.2 mmol/L (ref 3.5–5.2)
Sodium: 139 mmol/L (ref 134–144)
eGFR: 64 mL/min/1.73 (ref >59)

## 2024-03-05 ENCOUNTER — Ambulatory Visit: Payer: Self-pay | Admitting: Gastroenterology

## 2024-03-29 DIAGNOSIS — R5383 Other fatigue: Secondary | ICD-10-CM | POA: Diagnosis not present

## 2024-03-29 DIAGNOSIS — M2560 Stiffness of unspecified joint, not elsewhere classified: Secondary | ICD-10-CM | POA: Diagnosis not present

## 2024-03-29 DIAGNOSIS — M2540 Effusion, unspecified joint: Secondary | ICD-10-CM | POA: Diagnosis not present

## 2024-03-29 DIAGNOSIS — M0609 Rheumatoid arthritis without rheumatoid factor, multiple sites: Secondary | ICD-10-CM | POA: Diagnosis not present

## 2024-03-29 DIAGNOSIS — M79642 Pain in left hand: Secondary | ICD-10-CM | POA: Diagnosis not present

## 2024-03-29 DIAGNOSIS — M79641 Pain in right hand: Secondary | ICD-10-CM | POA: Diagnosis not present

## 2024-05-31 ENCOUNTER — Encounter: Payer: Self-pay | Admitting: Gastroenterology

## 2024-07-11 DIAGNOSIS — Z780 Asymptomatic menopausal state: Secondary | ICD-10-CM

## 2024-07-28 ENCOUNTER — Ambulatory Visit (HOSPITAL_COMMUNITY)
Admission: RE | Admit: 2024-07-28 | Discharge: 2024-07-28 | Disposition: A | Discharge status: Home / Self Care | Source: Ambulatory Visit | Attending: Family Medicine | Admitting: Family Medicine

## 2024-07-28 DIAGNOSIS — Z780 Asymptomatic menopausal state: Secondary | ICD-10-CM | POA: Insufficient documentation

## 2024-08-10 ENCOUNTER — Other Ambulatory Visit: Payer: Self-pay

## 2024-08-10 DIAGNOSIS — K7460 Unspecified cirrhosis of liver: Secondary | ICD-10-CM

## 2024-08-23 ENCOUNTER — Ambulatory Visit: Admitting: Gastroenterology
